# Patient Record
Sex: Female | Born: 1980 | Hispanic: Yes | Marital: Married | Smoking: Never smoker
Health system: Southern US, Community
[De-identification: ages and names within clinical notes are randomized; demographics above are authoritative.]

## PROBLEM LIST (undated history)

## (undated) ENCOUNTER — Inpatient Hospital Stay (HOSPITAL_COMMUNITY): Payer: Self-pay

## (undated) DIAGNOSIS — O139 Gestational [pregnancy-induced] hypertension without significant proteinuria, unspecified trimester: Secondary | ICD-10-CM

## (undated) DIAGNOSIS — O24419 Gestational diabetes mellitus in pregnancy, unspecified control: Secondary | ICD-10-CM

## (undated) DIAGNOSIS — I10 Essential (primary) hypertension: Secondary | ICD-10-CM

## (undated) DIAGNOSIS — G43909 Migraine, unspecified, not intractable, without status migrainosus: Secondary | ICD-10-CM

## (undated) DIAGNOSIS — E039 Hypothyroidism, unspecified: Secondary | ICD-10-CM

---

## 2011-11-04 ENCOUNTER — Encounter: Payer: Self-pay | Admitting: Emergency Medicine

## 2011-11-04 ENCOUNTER — Emergency Department (HOSPITAL_COMMUNITY)
Admission: EM | Admit: 2011-11-04 | Discharge: 2011-11-04 | Disposition: A | Discharge status: Home / Self Care | Payer: 59 | Attending: Emergency Medicine | Admitting: Emergency Medicine

## 2011-11-04 ENCOUNTER — Emergency Department (HOSPITAL_COMMUNITY): Payer: 59

## 2011-11-04 DIAGNOSIS — R079 Chest pain, unspecified: Secondary | ICD-10-CM | POA: Insufficient documentation

## 2011-11-04 DIAGNOSIS — J3489 Other specified disorders of nose and nasal sinuses: Secondary | ICD-10-CM | POA: Insufficient documentation

## 2011-11-04 DIAGNOSIS — J189 Pneumonia, unspecified organism: Secondary | ICD-10-CM | POA: Insufficient documentation

## 2011-11-04 DIAGNOSIS — R059 Cough, unspecified: Secondary | ICD-10-CM | POA: Insufficient documentation

## 2011-11-04 DIAGNOSIS — E039 Hypothyroidism, unspecified: Secondary | ICD-10-CM | POA: Insufficient documentation

## 2011-11-04 DIAGNOSIS — R05 Cough: Secondary | ICD-10-CM | POA: Insufficient documentation

## 2011-11-04 DIAGNOSIS — R112 Nausea with vomiting, unspecified: Secondary | ICD-10-CM | POA: Insufficient documentation

## 2011-11-04 HISTORY — DX: Hypothyroidism, unspecified: E03.9

## 2011-11-04 HISTORY — DX: Migraine, unspecified, not intractable, without status migrainosus: G43.909

## 2011-11-04 LAB — URINALYSIS, ROUTINE W REFLEX MICROSCOPIC
Bilirubin Urine: NEGATIVE
Glucose, UA: >1000 mg/dL — AB
Ketones, ur: NEGATIVE mg/dL
Protein, ur: 30 mg/dL — AB

## 2011-11-04 LAB — CBC
HCT: 39.5 % (ref 36.0–46.0)
MCH: 27.6 pg (ref 26.0–34.0)
MCHC: 32.9 g/dL (ref 30.0–36.0)
MCV: 83.9 fL (ref 78.0–100.0)
RDW: 13.1 % (ref 11.5–15.5)

## 2011-11-04 LAB — COMPREHENSIVE METABOLIC PANEL
AST: 30 U/L (ref 0–37)
CO2: 25 mEq/L (ref 19–32)
Calcium: 9.1 mg/dL (ref 8.4–10.5)
Creatinine, Ser: 0.62 mg/dL (ref 0.50–1.10)
GFR calc non Af Amer: >90 mL/min (ref >90)

## 2011-11-04 LAB — DIFFERENTIAL
Basophils Absolute: 0 10*3/uL (ref 0.0–0.1)
Basophils Relative: 0 % (ref 0–1)
Eosinophils Relative: 1 % (ref 0–5)
Lymphocytes Relative: 13 % (ref 12–46)
Monocytes Absolute: 0.8 10*3/uL (ref 0.1–1.0)

## 2011-11-04 LAB — URINE MICROSCOPIC-ADD ON

## 2011-11-04 MED ORDER — DEXTROSE 5 % IV SOLN
500.0000 mg | Freq: Once | INTRAVENOUS | Status: AC
  Administered 2011-11-04: 500 mg via INTRAVENOUS
  Filled 2011-11-04: qty 500

## 2011-11-04 MED ORDER — AZITHROMYCIN 250 MG PO TABS: 250.0000 mg | ORAL_TABLET | Freq: Every day | ORAL | Status: AC

## 2011-11-04 MED ORDER — SODIUM CHLORIDE 0.9 % IV BOLUS (SEPSIS)
1000.0000 mL | Freq: Once | INTRAVENOUS | Status: AC
  Administered 2011-11-04: 1000 mL via INTRAVENOUS

## 2011-11-04 MED ORDER — DEXTROSE 5 % IV SOLN
1.0000 g | Freq: Once | INTRAVENOUS | Status: AC
  Administered 2011-11-04: 1 g via INTRAVENOUS
  Filled 2011-11-04: qty 10

## 2011-11-04 MED ORDER — ONDANSETRON HCL 4 MG/2ML IJ SOLN
4.0000 mg | Freq: Once | INTRAMUSCULAR | Status: AC
  Administered 2011-11-04: 4 mg via INTRAVENOUS
  Filled 2011-11-04: qty 2

## 2011-11-04 MED ORDER — ONDANSETRON HCL 4 MG PO TABS: 4.0000 mg | ORAL_TABLET | Freq: Four times a day (QID) | ORAL | Status: AC

## 2011-11-04 NOTE — ED Notes (Signed)
PT. REPORTS SORE THROAT, VOMTTING ,  RUNNY NOSE ,HEADACHE AND RIGHT BACK PAIN ONSET 3 DAYS AGO.

## 2011-11-04 NOTE — ED Notes (Signed)
Patient currently sitting up in bed; no respiratory or acute distress noted.  Patient states that the Zofran relieved her nausea.  Patient has no other requests, questions, or concerns at this time.  Will continue to monitor.

## 2011-11-04 NOTE — ED Notes (Signed)
Patient transported to X-ray with xray tech 

## 2011-11-04 NOTE — ED Notes (Signed)
Patient states that she has had a cold for three days.  Complaining of nausea and vomiting starting tonight; states that she has thrown up 6-7 times (states that all she can throw up is mucus).  Patient reporting lightheadedness, hoarse voice, fever, and chills.  Patient denies abdominal pain, chest pain, and shortness of breath, but reports left sided flank plan (states that it started after throwing up several times).  Patient alert and oriented x4; PERRL present.  Family at bedside.  Will continue to monitor.

## 2011-11-04 NOTE — ED Provider Notes (Addendum)
History     CSN: [id number] Arrival date & time: 11/04/2011  5:14 AM   First MD Initiated Contact with Patient 11/04/11 [phone]      Chief Complaint  Patient presents with  . Emesis  . Nasal Congestion    (Consider location/radiation/quality/duration/timing/severity/associated sxs/prior treatment) Patient is a 30 y.o. female presenting with vomiting.  Emesis  This is a new problem. The current episode started 2 days ago.   patient reports cold symptoms including nasal congestion, and cough over the past few days. She also had a sore throat. She feels the cold symptoms are actually improving. However, she is now having nausea, vomiting, and has some pain in the right posterior portion of her, chest. She's had no fevers no hemoptysis. She denies any sick contacts. Denies any diarrhea or abdominal pain.  Past Medical History  Diagnosis Date  . Hypothyroidism   . Migraine headache   . Hypothyroidism     History reviewed. No pertinent past surgical history.  No family history on file.  History  Substance Use Topics  . Smoking status: Never Smoker   . Smokeless tobacco: Not on file  . Alcohol Use: No    OB History    Grav Para Term Preterm Abortions TAB SAB Ect Mult Living                  Review of Systems  Gastrointestinal: Positive for vomiting.  All other systems reviewed and are negative.    Allergies  Review of patient's allergies indicates no known allergies.  Home Medications   Current Outpatient Rx  Name Route Sig Dispense Refill  . DEXTROMETHORPHAN POLISTIREX 30 MG/5ML PO LQCR Oral Take 60 mg by mouth as needed.      . IBUPROFEN 200 MG PO TABS Oral Take 400 mg by mouth every 6 (six) hours as needed. For pain and fever     . LEVOTHYROXINE SODIUM 50 MCG PO TABS Oral Take 50 mcg by mouth daily.      Marland Kitchen OVER THE COUNTER MEDICATION  Tylenol Cold and Flu       BP 152/97  Pulse 88  Temp(Src) 100 F (37.8 C) (Oral)  Resp 20  SpO2 100%  LMP  10/31/2011  Physical Exam  Constitutional: She is oriented to person, place, and time. She appears well-developed and well-nourished. No distress.  HENT:  Head: Normocephalic and atraumatic.  Mouth/Throat: Oropharynx is clear and moist. No oropharyngeal exudate.  Eyes: Conjunctivae and EOM are normal. Pupils are equal, round, and reactive to light.  Neck: Neck supple.  Cardiovascular: Normal rate and regular rhythm.  Exam reveals no gallop and no friction rub.   No murmur heard. Pulmonary/Chest: Breath sounds normal. She has no wheezes. She has no rales. She exhibits no tenderness.  Abdominal: Soft. Bowel sounds are normal. She exhibits no distension. There is no tenderness. There is no rebound and no guarding.  Musculoskeletal: Normal range of motion.  Neurological: She is alert and oriented to person, place, and time. No cranial nerve deficit. Coordination normal.  Skin: Skin is warm and dry. No rash noted.  Psychiatric: She has a normal mood and affect.    ED Course  Procedures (including critical care time)  Labs Reviewed  CBC - Abnormal; Notable for the following:    WBC 15.9 (*)    All other components within normal limits  DIFFERENTIAL - Abnormal; Notable for the following:    Neutrophils Relative 81 (*)    Neutro Abs 12.9 (*)  All other components within normal limits  COMPREHENSIVE METABOLIC PANEL - Abnormal; Notable for the following:    Glucose, Bld 161 (*)    Total Protein 8.4 (*)    Alkaline Phosphatase 131 (*)    All other components within normal limits  URINALYSIS, ROUTINE W REFLEX MICROSCOPIC  POCT PREGNANCY, URINE   Dg Chest 2 View  11/04/2011  *RADIOLOGY REPORT*  Clinical Data: Cough, vomiting and sore throat; right posterior chest pain.  CHEST - 2 VIEW  Comparison: None.  Findings: The lungs are mildly hypoexpanded.  There is focal patchy airspace opacification at the superior aspect of the right lower lobe, compatible with pneumonia.  There is no  evidence of pleural effusion or pneumothorax.  The heart is normal in size; the mediastinal contour is within normal limits.  No acute osseous abnormalities are seen.  IMPRESSION: Focal pneumonia at the superior aspect of the right lower lobe; lungs mildly hypoexpanded.  Original Report Authenticated By: Tonia Ghent, M.D.     No diagnosis found.    MDM  Pt is seen and examined;  Initial history and physical completed.  Will follow.          Hanna Ra A. Patrica Duel, MD 11/04/11 0542    Lorelle Gibbs. Patrica Duel, MD 11/05/11 [phone]

## 2011-11-04 NOTE — ED Notes (Signed)
Patient back from x-ray.  Currently sitting up in bed; no respiratory or acute distress noted.  Patient has no questions, concerns, or requests at this time.  Family present at bedside.  Will continue to monitor.

## 2011-11-04 NOTE — ED Notes (Signed)
Pt ambulated to restroom w/o difficulties, pt was unable to void. Clean catch cup at bedside.

## 2012-07-20 ENCOUNTER — Encounter (HOSPITAL_COMMUNITY): Payer: Self-pay | Admitting: Emergency Medicine

## 2012-07-20 ENCOUNTER — Emergency Department (HOSPITAL_COMMUNITY)
Admission: EM | Admit: 2012-07-20 | Discharge: 2012-07-20 | Disposition: A | Discharge status: Home / Self Care | Payer: 59 | Attending: Emergency Medicine | Admitting: Emergency Medicine

## 2012-07-20 DIAGNOSIS — Z79899 Other long term (current) drug therapy: Secondary | ICD-10-CM | POA: Insufficient documentation

## 2012-07-20 DIAGNOSIS — E039 Hypothyroidism, unspecified: Secondary | ICD-10-CM | POA: Insufficient documentation

## 2012-07-20 DIAGNOSIS — J069 Acute upper respiratory infection, unspecified: Secondary | ICD-10-CM

## 2012-07-20 MED ORDER — OXYCODONE-ACETAMINOPHEN 5-325 MG PO TABS: 1.0000 | ORAL_TABLET | Freq: Four times a day (QID) | ORAL | Status: AC | PRN

## 2012-07-20 NOTE — ED Provider Notes (Signed)
History  This chart was scribed for [employer]. Rubin Payor, MD by Erskine Emery. This patient was seen in room TR08C/TR08C and the patient's care was started at 10:56.  CSN: [id number]  Arrival date & time 07/20/12  0945   First MD Initiated Contact with Patient 07/20/12 1056      Chief Complaint  Patient presents with  . URI  . Otalgia    (Consider location/radiation/quality/duration/timing/severity/associated sxs/prior treatment) HPI [name] Curtis is a 31 y.o. female who presents to the Emergency Department complaining of head congestion, as of yesterday.  No one sick around her Swelling on face No h/o allergies Mild fever 102,  No nausea, vomiting, no chance of pregnancy Ears clogged Hypothyroid and taking levo for it, but otherwise healthy. Taking tylenol congestion with only mild relief.    Past Medical History  Diagnosis Date  . Hypothyroidism   . Migraine headache   . Hypothyroidism     History reviewed. No pertinent past surgical history.  History reviewed. No pertinent family history.  History  Substance Use Topics  . Smoking status: Never Smoker   . Smokeless tobacco: Not on file  . Alcohol Use: No    OB History    Grav Para Term Preterm Abortions TAB SAB Ect Mult Living                  Review of Systems  Constitutional: Negative for fever and chills.  Respiratory: Negative for shortness of breath.   Gastrointestinal: Negative for nausea and vomiting.  Neurological: Negative for weakness.    Allergies  Review of patient's allergies indicates no known allergies.  Home Medications   Current Outpatient Rx  Name Route Sig Dispense Refill  . GUAIFENESIN 100 MG/5ML PO SYRP Oral Take 600 mg by mouth every 4 (four) hours as needed. For cough and congestion    . LEVOTHYROXINE SODIUM 50 MCG PO TABS Oral Take 50 mcg by mouth daily.    Marland Kitchen NAPROXEN SODIUM 220 MG PO TABS Oral Take 220-440 mg by mouth daily as needed. For pain and headache    .  PHENYLEPHRINE-DM-GG-APAP 5-10-200-325 MG PO TABS Oral Take 2 tablets by mouth every 4 (four) hours as needed. For headache and sinus pressure    . OXYCODONE-ACETAMINOPHEN 5-325 MG PO TABS Oral Take 1-2 tablets by mouth every 6 (six) hours as needed for pain. 10 tablet 0    BP 196/109  Pulse 94  Temp 98.5 F (36.9 C) (Oral)  Resp 18  SpO2 98%  Physical Exam  Nursing note and vitals reviewed. Constitutional: She is oriented to person, place, and time. She appears well-developed and well-nourished. No distress.  HENT:  Head: Normocephalic and atraumatic.       Mild bilat tm, no bulging, lungs clear trache mildling, anter cev aden, tender   Eyes: EOM are normal.  Neck: Neck supple. No tracheal deviation present.  Cardiovascular: Normal rate.   Pulmonary/Chest: Effort normal. No respiratory distress.  Musculoskeletal: Normal range of motion.  Neurological: She is alert and oriented to person, place, and time. She displays abnormal reflex.  Skin: Skin is warm and dry.  Psychiatric: She has a normal mood and affect. Her behavior is normal.    ED Course  Procedures (including critical care time) DIAGNOSTIC STUDIES: Oxygen Saturation is 98% on room air, normal by my interpretation.    COORDINATION OF CARE: 11:08--I evaluated the patient and we discussed a treatment plan including pain medication and decongestants to which the pt agreed. I notified  her to come back if her symptoms do not improve after a few days.    Labs Reviewed - No data to display No results found.   1. URI (upper respiratory infection)       MDM   Patient with URI symptoms. She's had for 2 days. Lungs are clear posterior pharynx does not look like strep. She has some hypertension which may be followed, but will not be treated now. She'll be given pain medicines and was given followup instructions.      I personally performed the services described in this documentation, which was scribed in my presence.  The recorded information has been reviewed and considered.           Juliet Rude. Rubin Payor, MD 07/20/12 1128

## 2012-07-20 NOTE — ED Notes (Signed)
Pt c/o head congestion and sinus pressure x 2 days with earache and chills

## 2012-12-19 NOTE — L&D Delivery Note (Signed)
Delivery Note  SVD viable female Apgars 9,9 over 2nd degree ML lac.  Placenta delivered spontaneously intact with 3VC. Repair with 2-0 Chromic with good support and hemostasis noted and R/V exam confirms.  PH art was sent.  [place] cord blood was not done.  Mother and baby were doing well.  EBL 350cc  Candice Camp, MD

## 2013-04-03 LAB — OB RESULTS CONSOLE ABO/RH: "RH Type ": POSITIVE

## 2013-04-03 LAB — OB RESULTS CONSOLE RUBELLA ANTIBODY, IGM: Rubella: IMMUNE

## 2013-04-03 LAB — OB RESULTS CONSOLE GC/CHLAMYDIA
Chlamydia: NEGATIVE
Gonorrhea: NEGATIVE

## 2013-04-03 LAB — OB RESULTS CONSOLE RPR: RPR: NONREACTIVE

## 2013-04-03 LAB — OB RESULTS CONSOLE GBS: GBS: NEGATIVE

## 2013-04-03 LAB — OB RESULTS CONSOLE ANTIBODY SCREEN: Antibody Screen: NEGATIVE

## 2013-09-04 ENCOUNTER — Inpatient Hospital Stay (HOSPITAL_COMMUNITY): Payer: 59

## 2013-09-04 ENCOUNTER — Inpatient Hospital Stay (HOSPITAL_COMMUNITY)
Admission: AD | Admit: 2013-09-04 | Discharge: 2013-09-04 | Disposition: A | Discharge status: Home / Self Care | Payer: 59 | Source: Ambulatory Visit | Attending: Obstetrics and Gynecology | Admitting: Obstetrics and Gynecology

## 2013-09-04 ENCOUNTER — Encounter (HOSPITAL_COMMUNITY): Payer: Self-pay

## 2013-09-04 DIAGNOSIS — O99891 Other specified diseases and conditions complicating pregnancy: Secondary | ICD-10-CM | POA: Insufficient documentation

## 2013-09-04 DIAGNOSIS — R51 Headache: Secondary | ICD-10-CM | POA: Insufficient documentation

## 2013-09-04 DIAGNOSIS — R030 Elevated blood-pressure reading, without diagnosis of hypertension: Secondary | ICD-10-CM | POA: Insufficient documentation

## 2013-09-04 LAB — URINALYSIS, ROUTINE W REFLEX MICROSCOPIC
Bilirubin Urine: NEGATIVE
Glucose, UA: 100 mg/dL — AB
Hgb urine dipstick: NEGATIVE
Ketones, ur: NEGATIVE mg/dL
Protein, ur: NEGATIVE mg/dL

## 2013-09-04 LAB — CBC
HCT: 35.4 % — ABNORMAL LOW (ref 36.0–46.0)
MCH: 30.1 pg (ref 26.0–34.0)
MCHC: 34.5 g/dL (ref 30.0–36.0)
MCV: 87.4 fL (ref 78.0–100.0)
RDW: 14.1 % (ref 11.5–15.5)

## 2013-09-04 LAB — COMPREHENSIVE METABOLIC PANEL
Albumin: 3 g/dL — ABNORMAL LOW (ref 3.5–5.2)
BUN: 6 mg/dL (ref 6–23)
Calcium: 10 mg/dL (ref 8.4–10.5)
Creatinine, Ser: 0.56 mg/dL (ref 0.50–1.10)
GFR calc Af Amer: >90 mL/min (ref >90)
Total Protein: 7.2 g/dL (ref 6.0–8.3)

## 2013-09-04 LAB — LACTATE DEHYDROGENASE: LDH: 138 U/L (ref 94–250)

## 2013-09-04 LAB — URINE MICROSCOPIC-ADD ON

## 2013-09-04 LAB — URIC ACID: Uric Acid, Serum: 2.9 mg/dL (ref 2.4–7.0)

## 2013-09-04 MED ORDER — ACETAMINOPHEN 325 MG PO TABS
650.0000 mg | ORAL_TABLET | Freq: Once | ORAL | Status: AC
  Administered 2013-09-04: 650 mg via ORAL

## 2013-09-04 NOTE — MAU Provider Note (Signed)
  History     CSN: [id number]  Arrival date and time: 09/04/13 1210   None     Chief Complaint  Patient presents with  . Hypertension  . Facial Swelling  . Headache   HPI [name] Curtis is 32 y.o. G3P0020 [redacted]w[redacted]d weeks presenting for elevated blood pressure in pregnancy.  Patient of Dr.Adkins but was seen in the office today by Dr. Arelia Sneddon.  Her blood pressure was elevated there.  She was sent here for further evaluation.  Dr. Arelia Sneddon called in orders.  Hx of hypertension with this pregnancy in the first trimester.  Is treated with labetalol 200mg  tid.  Has moderate headache that she has not taken anything. Denies visual changes.   Had nausea and vomiting this am that she feels is related to headache. Has swelling in her hands and feet.  Neg protein in her urine today per her report.  Passed her diabetes testing.     Past Medical History  Diagnosis Date  . Hypothyroidism   . Migraine headache   . Hypothyroidism     History reviewed. No pertinent past surgical history.  History reviewed. No pertinent family history.  History  Substance Use Topics  . Smoking status: Never Smoker   . Smokeless tobacco: Not on file  . Alcohol Use: No    Allergies: No Known Allergies  Prescriptions prior to admission  Medication Sig Dispense Refill  . labetalol (NORMODYNE) 200 MG tablet Take 200 mg by mouth 3 (three) times daily.      Marland Kitchen levothyroxine (SYNTHROID, LEVOTHROID) 50 MCG tablet Take 50 mcg by mouth daily.      . Prenatal Vit-Fe Fumarate-FA (PRENATAL MULTIVITAMIN) TABS tablet Take 1 tablet by mouth daily at 12 noon.        ROS Physical Exam   Blood pressure 153/76, pulse 88, resp. rate 20, height 5' 2.5" (1.588 m), weight 229 lb 12.8 oz (104.237 kg), SpO2 98.00%.  Physical Exam  MAU Course  Procedures  MDM Tylenol 650mg  po given for headache.  Darl Pikes, RN will report labs, blood pressure readings and U/S (BPP8/8 and measurements to Dr. Arelia Sneddon)  Assessment and Plan  A:   Medical Screening Exam  P:  PLan of care per Dr. Levie Heritage M 09/04/2013, 3:09 PM

## 2013-09-04 NOTE — MAU Note (Signed)
Patient was seen in the office this am and had elevated blood pressure and increase in swelling in face, hands and feet. Was sent to MAU for further evaluation. Patient states she vomited once this am and now has a headache. Denies bleeding, leaking or contractions and reports good fetal movement.

## 2013-09-23 ENCOUNTER — Encounter (HOSPITAL_COMMUNITY): Payer: Self-pay | Admitting: *Deleted

## 2013-09-23 ENCOUNTER — Observation Stay (HOSPITAL_COMMUNITY)
Admission: AD | Admit: 2013-09-23 | Discharge: 2013-09-24 | Disposition: A | Discharge status: Home / Self Care | Payer: 59 | Source: Ambulatory Visit | Attending: Obstetrics and Gynecology | Admitting: Obstetrics and Gynecology

## 2013-09-23 ENCOUNTER — Observation Stay (HOSPITAL_COMMUNITY): Payer: 59

## 2013-09-23 DIAGNOSIS — O10019 Pre-existing essential hypertension complicating pregnancy, unspecified trimester: Principal | ICD-10-CM | POA: Insufficient documentation

## 2013-09-23 DIAGNOSIS — O10913 Unspecified pre-existing hypertension complicating pregnancy, third trimester: Secondary | ICD-10-CM

## 2013-09-23 LAB — COMPREHENSIVE METABOLIC PANEL
ALT: 13 U/L (ref 0–35)
AST: 20 U/L (ref 0–37)
Albumin: 2.8 g/dL — ABNORMAL LOW (ref 3.5–5.2)
CO2: 20 mEq/L (ref 19–32)
Calcium: 9.9 mg/dL (ref 8.4–10.5)
Chloride: 99 mEq/L (ref 96–112)
GFR calc non Af Amer: >90 mL/min (ref >90)
Sodium: 133 mEq/L — ABNORMAL LOW (ref 135–145)

## 2013-09-23 LAB — CBC
HCT: 35 % — ABNORMAL LOW (ref 36.0–46.0)
MCH: 29.8 pg (ref 26.0–34.0)
MCHC: 34 g/dL (ref 30.0–36.0)
MCV: 87.5 fL (ref 78.0–100.0)
RDW: 14.5 % (ref 11.5–15.5)

## 2013-09-23 MED ORDER — ACETAMINOPHEN 325 MG PO TABS
650.0000 mg | ORAL_TABLET | ORAL | Status: DC | PRN
  Administered 2013-09-24: 650 mg via ORAL
  Filled 2013-09-23: qty 2

## 2013-09-23 MED ORDER — PRENATAL MULTIVITAMIN CH
1.0000 | ORAL_TABLET | Freq: Every day | ORAL | Status: DC
  Administered 2013-09-24: 1 via ORAL
  Filled 2013-09-23: qty 1

## 2013-09-23 MED ORDER — LABETALOL HCL 300 MG PO TABS
300.0000 mg | ORAL_TABLET | Freq: Three times a day (TID) | ORAL | Status: DC
  Administered 2013-09-23 – 2013-09-24 (×3): 300 mg via ORAL
  Filled 2013-09-23 (×5): qty 1

## 2013-09-23 MED ORDER — ZOLPIDEM TARTRATE 5 MG PO TABS: 5.0000 mg | ORAL_TABLET | Freq: Every evening | ORAL | Status: DC | PRN

## 2013-09-23 MED ORDER — CALCIUM CARBONATE ANTACID 500 MG PO CHEW: 2.0000 | CHEWABLE_TABLET | ORAL | Status: DC | PRN

## 2013-09-23 MED ORDER — DOCUSATE SODIUM 100 MG PO CAPS
100.0000 mg | ORAL_CAPSULE | Freq: Every day | ORAL | Status: DC
  Administered 2013-09-24: 100 mg via ORAL
  Filled 2013-09-23: qty 1

## 2013-09-23 MED ORDER — LEVOTHYROXINE SODIUM 50 MCG PO TABS
50.0000 ug | ORAL_TABLET | Freq: Every day | ORAL | Status: DC
  Administered 2013-09-23: 50 ug via ORAL
  Filled 2013-09-23 (×2): qty 1

## 2013-09-23 MED ORDER — PRENATAL MULTIVITAMIN CH: 1.0000 | ORAL_TABLET | Freq: Every day | ORAL | Status: DC

## 2013-09-23 NOTE — H&P (Signed)
[name] Curtis is a 32 y.o. female presenting at 34.2 with elevated blood pressure.  History of chronic hypertension on labetalol 300 mg tid.  Blood pressure today in office 180/102.  No PIH syptoms. Maternal Medical History:  Reason for admission: Elevated blood pressure  Fetal activity: Perceived fetal activity is normal.    Prenatal complications: PIH.   Prenatal Complications - Diabetes: none.    OB History   Grav Para Term Preterm Abortions TAB SAB Ect Mult Living   3    2 2     0     Past Medical History  Diagnosis Date  . Hypothyroidism   . Migraine headache   . Hypothyroidism    History reviewed. No pertinent past surgical history. Family History: family history includes Hypertension in her mother. Social History:  reports that she has never smoked. She does not have any smokeless tobacco history on file. She reports that she does not drink alcohol or use illicit drugs.   Prenatal Transfer Tool  Maternal Diabetes: No Genetic Screening: Normal Maternal Ultrasounds/Referrals: Normal Fetal Ultrasounds or other Referrals:  None Maternal Substance Abuse:  No Significant Maternal Medications:  None Significant Maternal Lab Results:  None Other Comments:  None  ROS    Blood pressure 151/80, pulse 80, temperature 98.5 F (36.9 C), temperature source Oral, resp. rate 24, height 5\' 2"  (1.575 m), weight 107.321 kg (236 lb 9.6 oz). Maternal Exam:  Abdomen: Patient reports no abdominal tenderness. Fundal height is c/w dates.   Fetal presentation: vertex  Cervix: not evaluated.   Physical Exam  Constitutional: She is oriented to person, place, and time.  Cardiovascular: Normal rate, regular rhythm and normal heart sounds.   Respiratory: Effort normal and breath sounds normal.  GI: Soft. Bowel sounds are normal.  gravid uterus c/w dates  Neurological: She is alert and oriented to person, place, and time. She has normal reflexes.    Prenatal labs: ABO, Rh:    Antibody:   Rubella:   RPR:    HBsAg:    HIV:    GBS:     Assessment/Plan: Intrauterine pregnancy at 34.2 with chronic hypertension with elevated blood pressure. Check labs nst q shift sono in am continue labetalol.   [name] Curtis S 09/23/2013, 2:32 PM

## 2013-09-24 ENCOUNTER — Observation Stay (HOSPITAL_COMMUNITY): Payer: 59

## 2013-09-24 LAB — CREATININE CLEARANCE, URINE, 24 HOUR
Collection Interval-CRCL: 24 h
Creatinine Clearance: 226 mL/min — ABNORMAL HIGH (ref 75–115)
Creatinine, 24H Ur: 2015 mg/d — ABNORMAL HIGH (ref 700–1800)
Creatinine, Urine: 82.24 mg/dL
Creatinine: 0.62 mg/dL (ref 0.50–1.10)
Urine Total Volume-CRCL: 2450 mL

## 2013-09-24 LAB — CBC
HCT: 33.1 % — ABNORMAL LOW (ref 36.0–46.0)
Hemoglobin: 11.3 g/dL — ABNORMAL LOW (ref 12.0–15.0)
MCH: 30 pg (ref 26.0–34.0)
MCHC: 34.1 g/dL (ref 30.0–36.0)
MCV: 87.8 fL (ref 78.0–100.0)
Platelets: 198 K/uL (ref 150–400)
RBC: 3.77 MIL/uL — ABNORMAL LOW (ref 3.87–5.11)
RDW: 14.5 % (ref 11.5–15.5)
WBC: 10.5 K/uL (ref 4.0–10.5)

## 2013-09-24 MED ORDER — LABETALOL HCL 300 MG PO TABS: 300.0000 mg | ORAL_TABLET | Freq: Three times a day (TID) | ORAL | Status: DC

## 2013-09-24 NOTE — Progress Notes (Signed)
Patient is resting well  Denies headache  Fetal movement is normal  FHR is category 1  Exam is normal Blood pressure is improved 24 hour urine complete today after 3 pm Platelets this am are normal  IMPRESSION: IUP at 34 w 3 days Chronic Hypertension  PLAN: Complete 24 hour urine  Repeat BPP If reassuring then discharge home on Labetalol

## 2013-09-24 NOTE — Discharge Summary (Signed)
  Admission Diagnosis: IUP at 34 weeks and 3 days Hypertension  Discharge Diagnosis: Same Probable Chronic Hypertension  Hospital Course: 32 year old female with hypertension sent from office for observation. 24 hour urine collected and results pending. PIH labs otherwise unremarkable. On day of discharge, blood pressure responded to labetalol 300 tid and she was sent home after BPP 8/8 She will follow up in office on Thursday.

## 2013-09-24 NOTE — Progress Notes (Signed)
UR chart review completed.  

## 2013-09-25 LAB — PROTEIN, URINE, 24 HOUR
Protein, 24H Urine: 196 mg/d — ABNORMAL HIGH (ref 50–100)
Urine Total Volume-UPROT: 2450 mL

## 2013-10-09 ENCOUNTER — Telehealth (HOSPITAL_COMMUNITY): Payer: Self-pay | Admitting: *Deleted

## 2013-10-09 NOTE — Telephone Encounter (Signed)
Preadmission screen  

## 2013-10-16 ENCOUNTER — Inpatient Hospital Stay (HOSPITAL_COMMUNITY): Admission: RE | Admit: 2013-10-16 | Payer: 59 | Source: Ambulatory Visit

## 2013-10-19 ENCOUNTER — Inpatient Hospital Stay (HOSPITAL_COMMUNITY)
Admission: RE | Admit: 2013-10-19 | Discharge: 2013-10-24 | DRG: 774 | Disposition: A | Discharge status: Home / Self Care | Payer: 59 | Source: Ambulatory Visit | Attending: Obstetrics and Gynecology | Admitting: Obstetrics and Gynecology

## 2013-10-19 DIAGNOSIS — O1002 Pre-existing essential hypertension complicating childbirth: Principal | ICD-10-CM | POA: Diagnosis present

## 2013-10-19 DIAGNOSIS — E079 Disorder of thyroid, unspecified: Secondary | ICD-10-CM | POA: Diagnosis present

## 2013-10-19 DIAGNOSIS — E039 Hypothyroidism, unspecified: Secondary | ICD-10-CM | POA: Diagnosis present

## 2013-10-19 LAB — CBC
HCT: 36.7 % (ref 36.0–46.0)
MCH: 30.2 pg (ref 26.0–34.0)
MCHC: 33.8 g/dL (ref 30.0–36.0)
MCV: 89.3 fL (ref 78.0–100.0)
Platelets: 231 10*3/uL (ref 150–400)
RDW: 15 % (ref 11.5–15.5)

## 2013-10-19 MED ORDER — FLEET ENEMA 7-19 GM/118ML RE ENEM: 1.0000 | ENEMA | RECTAL | Status: DC | PRN

## 2013-10-19 MED ORDER — MISOPROSTOL 25 MCG QUARTER TABLET
25.0000 ug | ORAL_TABLET | ORAL | Status: DC | PRN
  Administered 2013-10-19 – 2013-10-21 (×5): 25 ug via VAGINAL
  Filled 2013-10-19 (×5): qty 0.25

## 2013-10-19 MED ORDER — LACTATED RINGERS IV SOLN: 500.0000 mL | INTRAVENOUS | Status: DC | PRN

## 2013-10-19 MED ORDER — OXYTOCIN BOLUS FROM INFUSION: 500.0000 mL | INTRAVENOUS | Status: DC

## 2013-10-19 MED ORDER — TERBUTALINE SULFATE 1 MG/ML IJ SOLN: 0.2500 mg | Freq: Once | INTRAMUSCULAR | Status: AC | PRN

## 2013-10-19 MED ORDER — ONDANSETRON HCL 4 MG/2ML IJ SOLN: 4.0000 mg | Freq: Four times a day (QID) | INTRAMUSCULAR | Status: DC | PRN

## 2013-10-19 MED ORDER — LEVOTHYROXINE SODIUM 50 MCG PO TABS
50.0000 ug | ORAL_TABLET | Freq: Every day | ORAL | Status: DC
  Administered 2013-10-19 – 2013-10-21 (×3): 50 ug via ORAL
  Filled 2013-10-19 (×3): qty 1

## 2013-10-19 MED ORDER — LABETALOL HCL 300 MG PO TABS
300.0000 mg | ORAL_TABLET | Freq: Three times a day (TID) | ORAL | Status: DC
  Administered 2013-10-19 – 2013-10-21 (×7): 300 mg via ORAL
  Filled 2013-10-19 (×9): qty 1

## 2013-10-19 MED ORDER — CITRIC ACID-SODIUM CITRATE 334-500 MG/5ML PO SOLN: 30.0000 mL | ORAL | Status: DC | PRN

## 2013-10-19 MED ORDER — ZOLPIDEM TARTRATE 5 MG PO TABS
5.0000 mg | ORAL_TABLET | Freq: Every evening | ORAL | Status: DC | PRN
  Administered 2013-10-19: 5 mg via ORAL
  Filled 2013-10-19: qty 1

## 2013-10-19 MED ORDER — ACETAMINOPHEN 325 MG PO TABS
650.0000 mg | ORAL_TABLET | ORAL | Status: DC | PRN
  Administered 2013-10-21: 650 mg via ORAL
  Filled 2013-10-19: qty 2

## 2013-10-19 MED ORDER — OXYTOCIN 40 UNITS IN LACTATED RINGERS INFUSION - SIMPLE MED
62.5000 mL/h | INTRAVENOUS | Status: DC
  Filled 2013-10-19 (×2): qty 1000

## 2013-10-19 MED ORDER — LACTATED RINGERS IV SOLN
INTRAVENOUS | Status: DC
  Administered 2013-10-19 – 2013-10-21 (×4): via INTRAVENOUS

## 2013-10-19 MED ORDER — IBUPROFEN 600 MG PO TABS
600.0000 mg | ORAL_TABLET | Freq: Four times a day (QID) | ORAL | Status: DC | PRN
  Administered 2013-10-21: 600 mg via ORAL
  Filled 2013-10-19: qty 1

## 2013-10-19 MED ORDER — LEVOTHYROXINE SODIUM 50 MCG PO TABS
50.0000 ug | ORAL_TABLET | Freq: Every day | ORAL | Status: DC
  Filled 2013-10-19: qty 1

## 2013-10-19 MED ORDER — LIDOCAINE HCL (PF) 1 % IJ SOLN
30.0000 mL | INTRAMUSCULAR | Status: AC | PRN
  Administered 2013-10-21: 30 mL via SUBCUTANEOUS
  Filled 2013-10-19 (×2): qty 30

## 2013-10-19 MED ORDER — OXYCODONE-ACETAMINOPHEN 5-325 MG PO TABS: 1.0000 | ORAL_TABLET | ORAL | Status: DC | PRN

## 2013-10-19 NOTE — H&P (Signed)
[name] Curtis is a 32 y.o. female presenting at 11 weeks for induction of labor.  Prenatal course complicated by chronic hypertension.  Has been on labetalol..  GBS negative./ Maternal Medical History:  Reason for admission: Induction   Prenatal complications: PIH.   Prenatal Complications - Diabetes: none.    OB History   Grav Para Term Preterm Abortions TAB SAB Ect Mult Living   3    2 2     0     Past Medical History  Diagnosis Date  . Hypothyroidism   . Migraine headache   . Hypothyroidism    No past surgical history on file. Family History: family history includes Hypertension in her mother. Social History:  reports that she has never smoked. She does not have any smokeless tobacco history on file. She reports that she does not drink alcohol or use illicit drugs.   Prenatal Transfer Tool  Maternal Diabetes: No Genetic Screening: Normal Maternal Ultrasounds/Referrals: Normal Fetal Ultrasounds or other Referrals:  None Maternal Substance Abuse:  No Significant Maternal Medications:  Meds include: Other: labetalol Significant Maternal Lab Results:  None Other Comments:  None  ROS    Blood pressure 176/91, pulse 94, resp. rate 20, height 5\' 2"  (1.575 m), weight 107.049 kg (236 lb). Maternal Exam:  Uterine Assessment: Contraction strength is mild.  Contraction frequency is irregular.   Abdomen: Patient reports no abdominal tenderness. Fundal height is c/w dates.   Estimated fetal weight is 7.   Fetal presentation: vertex     Physical Exam  Constitutional: She is oriented to person, place, and time. She appears well-developed and well-nourished.  Cardiovascular: Normal rate, regular rhythm and normal heart sounds.   Respiratory: Effort normal and breath sounds normal.  GI:  Gravid uterus  Genitourinary:  Cervix fingertip  Musculoskeletal: She exhibits edema.  Neurological: She is oriented to person, place, and time. She has normal reflexes.    Prenatal  labs: ABO, Rh: A/Positive/-- (04/16 0000) Antibody: Negative (04/16 0000) Rubella: Immune (04/16 0000) RPR: Nonreactive (04/16 0000)  HBsAg: Negative (04/16 0000)  HIV: Non-reactive (04/16 0000)  GBS: Negative (04/16 0000)   Assessment/Plan: IUP at 39 weeks with chronic hypertension for induction Risk of pitocin discussed   [name] Curtis S 10/19/2013, 8:19 PM

## 2013-10-20 ENCOUNTER — Encounter (HOSPITAL_COMMUNITY): Payer: Self-pay

## 2013-10-20 LAB — SYPHILIS: RPR W/REFLEX TO RPR TITER AND TREPONEMAL ANTIBODIES, TRADITIONAL SCREENING AND DIAGNOSIS ALGORITHM: RPR Ser Ql: NONREACTIVE

## 2013-10-20 LAB — TYPE AND SCREEN
ABO/RH(D): A POS
Antibody Screen: NEGATIVE

## 2013-10-20 MED ORDER — OXYTOCIN 40 UNITS IN LACTATED RINGERS INFUSION - SIMPLE MED
1.0000 m[IU]/min | INTRAVENOUS | Status: DC
  Administered 2013-10-20: 2 m[IU]/min via INTRAVENOUS

## 2013-10-20 MED ORDER — ZOLPIDEM TARTRATE 5 MG PO TABS: 10.0000 mg | ORAL_TABLET | Freq: Every evening | ORAL | Status: DC | PRN

## 2013-10-20 MED ORDER — TERBUTALINE SULFATE 1 MG/ML IJ SOLN: 0.2500 mg | Freq: Once | INTRAMUSCULAR | Status: AC | PRN

## 2013-10-20 MED ORDER — MISOPROSTOL 25 MCG QUARTER TABLET
25.0000 ug | ORAL_TABLET | ORAL | Status: DC | PRN
  Filled 2013-10-20: qty 0.25

## 2013-10-20 NOTE — Progress Notes (Signed)
Patient ID: [name] Curtis, female   DOB: 1981/05/26, 32 y.o.   MRN: [id number] No progress on pitocin Cervix 1 cm 50 %  fhr cat one Stop pitocin and restart cytotec

## 2013-10-20 NOTE — Progress Notes (Signed)
Patient ID: [name] Curtis, female   DOB: 1981-11-25, 32 y.o.   MRN: [id number] Cervix 1 +  60 % vtx -2 fhr cat one Unable to rupture membranes Begin pitocin

## 2013-10-21 ENCOUNTER — Inpatient Hospital Stay (HOSPITAL_COMMUNITY): Payer: 59 | Admitting: Anesthesiology

## 2013-10-21 ENCOUNTER — Encounter (HOSPITAL_COMMUNITY): Payer: 59 | Admitting: Anesthesiology

## 2013-10-21 ENCOUNTER — Encounter (HOSPITAL_COMMUNITY): Payer: Self-pay

## 2013-10-21 LAB — CBC
Hemoglobin: 12.4 g/dL (ref 12.0–15.0)
MCH: 30.2 pg (ref 26.0–34.0)
MCHC: 34.2 g/dL (ref 30.0–36.0)
MCV: 88.3 fL (ref 78.0–100.0)
RDW: 15.1 % (ref 11.5–15.5)

## 2013-10-21 MED ORDER — EPHEDRINE 5 MG/ML INJ: 10.0000 mg | INTRAVENOUS | Status: DC | PRN

## 2013-10-21 MED ORDER — BENZOCAINE-MENTHOL 20-0.5 % EX AERO
1.0000 "application " | INHALATION_SPRAY | CUTANEOUS | Status: DC | PRN
  Administered 2013-10-22: 1 via TOPICAL
  Filled 2013-10-21 (×3): qty 56

## 2013-10-21 MED ORDER — IBUPROFEN 600 MG PO TABS
600.0000 mg | ORAL_TABLET | Freq: Four times a day (QID) | ORAL | Status: DC
  Administered 2013-10-22 – 2013-10-24 (×7): 600 mg via ORAL
  Filled 2013-10-21 (×8): qty 1

## 2013-10-21 MED ORDER — FENTANYL 2.5 MCG/ML BUPIVACAINE 1/10 % EPIDURAL INFUSION (WH - ANES)
14.0000 mL/h | INTRAMUSCULAR | Status: DC | PRN
  Administered 2013-10-21: 14 mL/h via EPIDURAL
  Filled 2013-10-21 (×2): qty 125

## 2013-10-21 MED ORDER — OXYCODONE-ACETAMINOPHEN 5-325 MG PO TABS: 1.0000 | ORAL_TABLET | ORAL | Status: DC | PRN

## 2013-10-21 MED ORDER — SENNOSIDES-DOCUSATE SODIUM 8.6-50 MG PO TABS
2.0000 | ORAL_TABLET | ORAL | Status: DC
  Filled 2013-10-21: qty 2

## 2013-10-21 MED ORDER — ONDANSETRON HCL 4 MG PO TABS: 4.0000 mg | ORAL_TABLET | ORAL | Status: DC | PRN

## 2013-10-21 MED ORDER — PRENATAL MULTIVITAMIN CH
1.0000 | ORAL_TABLET | Freq: Every day | ORAL | Status: DC
  Administered 2013-10-22 – 2013-10-23 (×2): 1 via ORAL
  Filled 2013-10-21 (×2): qty 1

## 2013-10-21 MED ORDER — LIDOCAINE HCL (PF) 1 % IJ SOLN
INTRAMUSCULAR | Status: DC | PRN
  Administered 2013-10-21 (×2): 4 mL

## 2013-10-21 MED ORDER — TETANUS-DIPHTH-ACELL PERTUSSIS 5-2.5-18.5 LF-MCG/0.5 IM SUSP
0.5000 mL | Freq: Once | INTRAMUSCULAR | Status: DC
  Filled 2013-10-21: qty 0.5

## 2013-10-21 MED ORDER — DIBUCAINE 1 % RE OINT
1.0000 "application " | TOPICAL_OINTMENT | RECTAL | Status: DC | PRN
  Filled 2013-10-21: qty 28

## 2013-10-21 MED ORDER — LANOLIN HYDROUS EX OINT: TOPICAL_OINTMENT | CUTANEOUS | Status: DC | PRN

## 2013-10-21 MED ORDER — PHENYLEPHRINE 40 MCG/ML (10ML) SYRINGE FOR IV PUSH (FOR BLOOD PRESSURE SUPPORT): 80.0000 ug | PREFILLED_SYRINGE | INTRAVENOUS | Status: DC | PRN

## 2013-10-21 MED ORDER — LEVOTHYROXINE SODIUM 50 MCG PO TABS
50.0000 ug | ORAL_TABLET | Freq: Every day | ORAL | Status: DC
  Administered 2013-10-22 – 2013-10-23 (×2): 50 ug via ORAL
  Filled 2013-10-21 (×4): qty 1

## 2013-10-21 MED ORDER — MEDROXYPROGESTERONE ACETATE 150 MG/ML IM SUSP: 150.0000 mg | INTRAMUSCULAR | Status: DC | PRN

## 2013-10-21 MED ORDER — SIMETHICONE 80 MG PO CHEW: 80.0000 mg | CHEWABLE_TABLET | ORAL | Status: DC | PRN

## 2013-10-21 MED ORDER — DIPHENHYDRAMINE HCL 25 MG PO CAPS: 25.0000 mg | ORAL_CAPSULE | Freq: Four times a day (QID) | ORAL | Status: DC | PRN

## 2013-10-21 MED ORDER — OXYTOCIN 40 UNITS IN LACTATED RINGERS INFUSION - SIMPLE MED
1.0000 m[IU]/min | INTRAVENOUS | Status: DC
  Administered 2013-10-21: 2 m[IU]/min via INTRAVENOUS

## 2013-10-21 MED ORDER — EPHEDRINE 5 MG/ML INJ
10.0000 mg | INTRAVENOUS | Status: DC | PRN
  Filled 2013-10-21: qty 4

## 2013-10-21 MED ORDER — LACTATED RINGERS IV SOLN
500.0000 mL | Freq: Once | INTRAVENOUS | Status: AC
  Administered 2013-10-21: 500 mL via INTRAVENOUS

## 2013-10-21 MED ORDER — ZOLPIDEM TARTRATE 5 MG PO TABS: 5.0000 mg | ORAL_TABLET | Freq: Every evening | ORAL | Status: DC | PRN

## 2013-10-21 MED ORDER — MEASLES, MUMPS & RUBELLA VAC ~~LOC~~ INJ
0.5000 mL | INJECTION | Freq: Once | SUBCUTANEOUS | Status: DC
  Filled 2013-10-21: qty 0.5

## 2013-10-21 MED ORDER — ONDANSETRON HCL 4 MG/2ML IJ SOLN: 4.0000 mg | INTRAMUSCULAR | Status: DC | PRN

## 2013-10-21 MED ORDER — FENTANYL 2.5 MCG/ML BUPIVACAINE 1/10 % EPIDURAL INFUSION (WH - ANES)
INTRAMUSCULAR | Status: DC | PRN
  Administered 2013-10-21: 12 mL/h via EPIDURAL

## 2013-10-21 MED ORDER — DIPHENHYDRAMINE HCL 50 MG/ML IJ SOLN: 12.5000 mg | INTRAMUSCULAR | Status: DC | PRN

## 2013-10-21 MED ORDER — WITCH HAZEL-GLYCERIN EX PADS: 1.0000 "application " | MEDICATED_PAD | CUTANEOUS | Status: DC | PRN

## 2013-10-21 MED ORDER — PHENYLEPHRINE 40 MCG/ML (10ML) SYRINGE FOR IV PUSH (FOR BLOOD PRESSURE SUPPORT)
80.0000 ug | PREFILLED_SYRINGE | INTRAVENOUS | Status: DC | PRN
  Filled 2013-10-21: qty 10

## 2013-10-21 NOTE — Progress Notes (Signed)
Pt states that neck pain is a little better due to being on side for a while

## 2013-10-21 NOTE — Progress Notes (Signed)
Patient ID: [name] Curtis, female   DOB: Aug 30, 1981, 32 y.o.   MRN: [id number] Pt without complaints VS BPs 140s/80-90s FHR 145s Cat 1 Ctx q 5 '  Cx  2-3/70/-3 AROM clear DTRS 1/4   CHTN - Continue induction Stable Anticipate SVD

## 2013-10-21 NOTE — Anesthesia Preprocedure Evaluation (Signed)
Anesthesia Evaluation  Patient identified by MRN, date of birth, ID band Patient awake    Reviewed: Allergy & Precautions, H&P , Patient's Chart, lab work & pertinent test results  Airway Mallampati: III TM Distance: >3 FB Neck ROM: Full    Dental no notable dental hx. (+) Teeth Intact   Pulmonary neg pulmonary ROS,  breath sounds clear to auscultation  Pulmonary exam normal       Cardiovascular hypertension, Pt. on medications and Pt. on home beta blockers Rhythm:Regular Rate:Normal  Gestational HTN   Neuro/Psych  Headaches, negative psych ROS   GI/Hepatic Neg liver ROS, GERD-  ,  Endo/Other  Hypothyroidism Morbid obesity  Renal/GU negative Renal ROS  negative genitourinary   Musculoskeletal negative musculoskeletal ROS (+)   Abdominal (+) + obese,   Peds  Hematology negative hematology ROS (+)   Anesthesia Other Findings   Reproductive/Obstetrics (+) Pregnancy                           Anesthesia Physical Anesthesia Plan  ASA: III  Anesthesia Plan: Epidural   Post-op Pain Management:    Induction:   Airway Management Planned: Natural Airway  Additional Equipment:   Intra-op Plan:   Post-operative Plan:   Informed Consent: I have reviewed the patients History and Physical, chart, labs and discussed the procedure including the risks, benefits and alternatives for the proposed anesthesia with the patient or authorized representative who has indicated his/her understanding and acceptance.     Plan Discussed with: Anesthesiologist  Anesthesia Plan Comments:         Anesthesia Quick Evaluation

## 2013-10-21 NOTE — Anesthesia Procedure Notes (Signed)
Epidural Patient location during procedure: OB Start time: 10/21/2013 10:18 AM  Staffing Anesthesiologist: Runell Kovich A. Performed by: anesthesiologist   Preanesthetic Checklist Completed: patient identified, site marked, surgical consent, pre-op evaluation, timeout performed, IV checked, risks and benefits discussed and monitors and equipment checked  Epidural Patient position: sitting Prep: site prepped and draped and DuraPrep Patient monitoring: continuous pulse ox and blood pressure Approach: midline Injection technique: LOR air  Needle:  Needle type: Tuohy  Needle gauge: 17 G Needle length: 9 cm and 9 Needle insertion depth: 9 cm Catheter type: closed end flexible Catheter size: 19 Gauge Catheter at skin depth: 14 cm Test dose: negative and Other  Assessment Events: blood not aspirated, injection not painful, no injection resistance, negative IV test and no paresthesia  Additional Notes Patient identified. Risks and benefits discussed including failed block, incomplete  Pain control, post dural puncture headache, nerve damage, paralysis, blood pressure Changes, nausea, vomiting, reactions to medications-both toxic and allergic and post Partum back pain. All questions were answered. Patient expressed understanding and wished to proceed. Sterile technique was used throughout procedure. Epidural site was Dressed with sterile barrier dressing. No paresthesias, signs of intravascular injection Or signs of intrathecal spread were encountered.  Patient was more comfortable after the epidural was dosed. Please see RN's note for documentation of vital signs and FHR which are stable.

## 2013-10-22 ENCOUNTER — Encounter (HOSPITAL_COMMUNITY): Payer: Self-pay

## 2013-10-22 LAB — CBC
HCT: 29.7 % — ABNORMAL LOW (ref 36.0–46.0)
HCT: 35.4 % — ABNORMAL LOW (ref 36.0–46.0)
Hemoglobin: 12.3 g/dL (ref 12.0–15.0)
MCH: 30.1 pg (ref 26.0–34.0)
MCH: 30.6 pg (ref 26.0–34.0)
MCHC: 34 g/dL (ref 30.0–36.0)
MCHC: 34.7 g/dL (ref 30.0–36.0)
MCV: 88.1 fL (ref 78.0–100.0)
MCV: 88.4 fL (ref 78.0–100.0)
Platelets: 176 10*3/uL (ref 150–400)
RDW: 14.9 % (ref 11.5–15.5)
RDW: 15.1 % (ref 11.5–15.5)
WBC: 16.7 10*3/uL — ABNORMAL HIGH (ref 4.0–10.5)

## 2013-10-22 NOTE — Lactation Note (Signed)
This note was copied from the chart of [name] Guinevere Stephenson. Lactation Consultation Note Mom states breast feeding is going well; c/o sore nipples at start of feeding which resolves within a few seconds. Mom states she does hear swallows when baby is breast feeding. Reviewed the importance of a deep latch with each feeding to protect the nipples. Enc mom to call the lactation office if she has any concerns. Lactation number provided, enc mom to make a follow up appt if nipple soreness does not resolve. Enc mom to attend the [facility].  Patient Name: [name] Curtis [id number] Date: 10/22/2013 Reason for consult: Follow-up assessment   Maternal Data    Feeding    LATCH Score/Interventions                      Lactation Tools Discussed/Used     Consult Status Consult Status: PRN    Lenard Forth 10/22/2013, 11:02 AM

## 2013-10-22 NOTE — Lactation Note (Signed)
This note was copied from the chart of [name] Daleena Rotter. Lactation Consultation Note    Follow up consult with this mom and term baby, now 40 hours old, and taken to the NICU with hypoglycemia a few hours ago. I started mom pumping with a DEP, in the premie setting, and hsowed her how to hand express. She was able to collect a few  Drops of colostrum. Basic teaching on pumping done from the NICU booklet on providing EBM.   I assisted mom with positioing hte baby in football hold, but he was too sleppy to even wake up. I helped mom hand express some colostrum drops into his mouth. I will follow this mom and baby in the NICU. Mom knows to call for questions/concerns.  Patient Name: [name] Curtis ZOXWR'U Date: 10/22/2013     Maternal Data    Feeding    LATCH Score/Interventions                      Lactation Tools Discussed/Used     Consult Status      [name] Curtis 10/22/2013, 4:58 PM

## 2013-10-22 NOTE — Progress Notes (Signed)
Post Partum Day 1 Subjective: no complaints, up ad lib, voiding and tolerating PO  Objective: Blood pressure 134/82, pulse 90, temperature 98.6 F (37 C), temperature source Oral, resp. rate 20, height 5\' 2"  (1.575 m), weight 236 lb (107.049 kg), SpO2 94.00%, unknown if currently breastfeeding.  Physical Exam:  General: alert and cooperative Lochia: appropriate Uterine Fundus: firm Incision: perineum intact DVT Evaluation: No evidence of DVT seen on physical exam. Negative Homan's sign. No cords or calf tenderness.   Recent Labs  10/22/13 0001 10/22/13 0545  HGB 12.3 10.1*  HCT 35.4* 29.7*    Assessment/Plan: Plan for discharge tomorrow and Circumcision prior to discharge   LOS: 3 days   Reannon Candella G 10/22/2013, 7:43 AM

## 2013-10-23 LAB — COMPREHENSIVE METABOLIC PANEL
ALT: 20 U/L (ref 0–35)
Albumin: 2.6 g/dL — ABNORMAL LOW (ref 3.5–5.2)
Alkaline Phosphatase: 107 U/L (ref 39–117)
CO2: 23 mEq/L (ref 19–32)
Calcium: 9.3 mg/dL (ref 8.4–10.5)
Creatinine, Ser: 0.74 mg/dL (ref 0.50–1.10)
GFR calc Af Amer: >90 mL/min (ref >90)
Potassium: 3.6 mEq/L (ref 3.5–5.1)
Sodium: 139 mEq/L (ref 135–145)
Total Bilirubin: 0.4 mg/dL (ref 0.3–1.2)
Total Protein: 6.2 g/dL (ref 6.0–8.3)

## 2013-10-23 LAB — CBC
MCH: 30.2 pg (ref 26.0–34.0)
MCHC: 34 g/dL (ref 30.0–36.0)
Platelets: 170 10*3/uL (ref 150–400)
RDW: 15.2 % (ref 11.5–15.5)

## 2013-10-23 MED ORDER — LABETALOL HCL 100 MG PO TABS
100.0000 mg | ORAL_TABLET | Freq: Three times a day (TID) | ORAL | Status: DC
  Administered 2013-10-23 (×3): 100 mg via ORAL
  Filled 2013-10-23 (×4): qty 1

## 2013-10-23 NOTE — Lactation Note (Signed)
This note was copied from the chart of [name] Joan Herschberger. Lactation Consultation Note    Follow up consult with this mom and term baby, in NICU. He is now 38 hours post partum, and has been very sleepy at the breast. I assisted mom with cross cradle hold . He would suckle for a minute or two, and then fall asleep. I used a 10 ml syringe with a cut 5 french ng as an SNS, and  he suckled at the breast and took  1.5 ml's of colostrum and 6 mls of formula. I will work with mom and baby  later today.  Patient Name: [name] Curtis ZOXWR'U Date: 10/23/2013 Reason for consult: Follow-up assessment;NICU baby   Maternal Data    Feeding Feeding Type: Breast Fed Length of feed: 15 min (on and off for 30 minutes)  LATCH Score/Interventions Latch: Repeated attempts needed to sustain latch, nipple held in mouth throughout feeding, stimulation needed to elicit sucking reflex. Intervention(s): Skin to skin;Teach feeding cues;Waking techniques Intervention(s): Assist with latch;Breast compression  Audible Swallowing: None Intervention(s): Hand expression Intervention(s): Skin to skin  Type of Nipple: Everted at rest and after stimulation Intervention(s): Hand pump  Comfort (Breast/Nipple): Soft / non-tender     Hold (Positioning): Assistance needed to correctly position infant at breast and maintain latch. Intervention(s): Breastfeeding basics reviewed;Support Pillows;Position options;Skin to skin  LATCH Score: 6  Lactation Tools Discussed/Used     Consult Status Consult Status: Follow-up Date: 10/24/13    Alfred Levins 10/23/2013, 12:57 PM

## 2013-10-23 NOTE — Progress Notes (Signed)
Post Partum Day 2 Subjective: no complaints, up ad lib, voiding, tolerating PO, + flatus and denies HA or RUQ pain. Reports that she had been on Labetalol at the end of her pregnancy, Dose of Labetalol had been 300 mg tid  Objective: Blood pressure 160/92, pulse 93, temperature 97.5 F (36.4 C), temperature source Oral, resp. rate 20, height 5\' 2"  (1.575 m), weight 236 lb (107.049 kg), SpO2 97.00%, unknown if currently breastfeeding.  Physical Exam:  General: alert and cooperative Lochia: appropriate Uterine Fundus: firm Incision: perineum intact DVT Evaluation: No evidence of DVT seen on physical exam. Negative Homan's sign. No cords or calf tenderness. Calf/Ankle edema is present. DTR's 2+ no clonus   Recent Labs  10/22/13 0001 10/22/13 0545  HGB 12.3 10.1*  HCT 35.4* 29.7*    Assessment/Plan: CHTN, will repeat labs and restart Labetalol, careful observation of BP.   LOS: 4 days   CURTIS,CAROL G 10/23/2013, 8:02 AM

## 2013-10-23 NOTE — Anesthesia Postprocedure Evaluation (Signed)
  Anesthesia Post-op Note  Patient: [name] Curtis  Procedure(s) Performed: * No procedures listed *  Patient Location: PACU and Mother/Baby  Anesthesia Type:Epidural  Level of Consciousness: awake, alert , oriented and patient cooperative  Airway and Oxygen Therapy: Patient Spontanous Breathing  Post-op Pain: none  Post-op Assessment: Post-op Vital signs reviewed, Patient's Cardiovascular Status Stable and Respiratory Function Stable  Post-op Vital Signs: Reviewed and stable  Complications: No apparent anesthesia complications

## 2013-10-23 NOTE — Plan of Care (Signed)
Problem: Discharge Progression Outcomes Goal: Complications resolved/controlled Outcome: Progressing Monitoring BP's and administrating blood pressure medication as ordered.

## 2013-10-24 ENCOUNTER — Ambulatory Visit: Payer: Self-pay

## 2013-10-24 MED ORDER — LABETALOL HCL 200 MG PO TABS: 200.0000 mg | ORAL_TABLET | Freq: Three times a day (TID) | ORAL | Status: DC

## 2013-10-24 MED ORDER — IBUPROFEN 600 MG PO TABS: 600.0000 mg | ORAL_TABLET | Freq: Four times a day (QID) | ORAL | Status: DC

## 2013-10-24 MED ORDER — LABETALOL HCL 200 MG PO TABS
200.0000 mg | ORAL_TABLET | Freq: Three times a day (TID) | ORAL | Status: DC
  Administered 2013-10-24: 200 mg via ORAL
  Filled 2013-10-24: qty 1

## 2013-10-24 NOTE — Lactation Note (Signed)
This note was copied from the chart of [name] Curtis. Lactation Consultation Note     Follow up consult with this mom of a NICU baby, now at 60 hours post partum, and being discharged to home today.   Mom sad to be leaving without baby. On exam, her milk is transitioning in. I advised mom to pump every 3 hours, until she stops dripping, to rest and stay hydrated. I will follow this family in the NICU. Mom knows to call for questions/concerns. She has a DEP at home.  Patient Name: [name] Jonathan Kirkendoll NFAOZ'H Date: 10/24/2013     Maternal Data    Feeding Feeding Type: Formula Nipple Type: Slow - flow Length of feed: 25 min  LATCH Score/Interventions                      Lactation Tools Discussed/Used     Consult Status      Alfred Levins 10/24/2013, 4:18 PM

## 2013-10-24 NOTE — Discharge Summary (Signed)
Obstetric Discharge Summary Reason for Admission: induction of labor Prenatal Procedures: ultrasound Intrapartum Procedures: spontaneous vaginal delivery Postpartum Procedures: none Complications-Operative and Postpartum: 2 degree perineal laceration Hemoglobin  Date Value Range Status  10/23/2013 9.7* 12.0 - 15.0 g/dL Final     HCT  Date Value Range Status  10/23/2013 28.5* 36.0 - 46.0 % Final    Physical Exam:  General: alert and cooperative, baby transferred to NICU yesterday for hypoglycemia Lochia: appropriate Uterine Fundus: firm Incision: perineum intact DVT Evaluation: No evidence of DVT seen on physical exam. Negative Homan's sign. No cords or calf tenderness.  Discharge Diagnoses: Term Pregnancy-delivered and chtn  Discharge Information: Date: 10/24/2013 Activity: pelvic rest Diet: routine Medications: PNV, Ibuprofen and labetalol Condition: stable Instructions: refer to practice specific booklet Discharge to: home   Newborn Data: Live born female  Birth Weight: 6 lb 8.4 oz (2960 g) APGAR: 9, 9  Home with nicu.  Kieron Kantner G 10/24/2013, 7:58 AM

## 2014-05-07 ENCOUNTER — Emergency Department (HOSPITAL_BASED_OUTPATIENT_CLINIC_OR_DEPARTMENT_OTHER)
Admission: EM | Admit: 2014-05-07 | Discharge: 2014-05-07 | Disposition: A | Discharge status: Home / Self Care | Payer: 59 | Attending: Emergency Medicine | Admitting: Emergency Medicine

## 2014-05-07 ENCOUNTER — Encounter (HOSPITAL_BASED_OUTPATIENT_CLINIC_OR_DEPARTMENT_OTHER): Payer: Self-pay | Admitting: Emergency Medicine

## 2014-05-07 ENCOUNTER — Emergency Department (HOSPITAL_BASED_OUTPATIENT_CLINIC_OR_DEPARTMENT_OTHER): Payer: 59

## 2014-05-07 DIAGNOSIS — Z791 Long term (current) use of non-steroidal anti-inflammatories (NSAID): Secondary | ICD-10-CM | POA: Insufficient documentation

## 2014-05-07 DIAGNOSIS — Z79899 Other long term (current) drug therapy: Secondary | ICD-10-CM | POA: Insufficient documentation

## 2014-05-07 DIAGNOSIS — A088 Other specified intestinal infections: Secondary | ICD-10-CM | POA: Insufficient documentation

## 2014-05-07 DIAGNOSIS — R110 Nausea: Secondary | ICD-10-CM | POA: Insufficient documentation

## 2014-05-07 DIAGNOSIS — Z8669 Personal history of other diseases of the nervous system and sense organs: Secondary | ICD-10-CM | POA: Insufficient documentation

## 2014-05-07 DIAGNOSIS — E039 Hypothyroidism, unspecified: Secondary | ICD-10-CM | POA: Insufficient documentation

## 2014-05-07 DIAGNOSIS — K529 Noninfective gastroenteritis and colitis, unspecified: Secondary | ICD-10-CM

## 2014-05-07 DIAGNOSIS — IMO0001 Reserved for inherently not codable concepts without codable children: Secondary | ICD-10-CM | POA: Insufficient documentation

## 2014-05-07 DIAGNOSIS — R6883 Chills (without fever): Secondary | ICD-10-CM | POA: Insufficient documentation

## 2014-05-07 DIAGNOSIS — I10 Essential (primary) hypertension: Secondary | ICD-10-CM | POA: Insufficient documentation

## 2014-05-07 HISTORY — DX: Essential (primary) hypertension: I10

## 2014-05-07 LAB — COMPREHENSIVE METABOLIC PANEL
ALBUMIN: 4 g/dL (ref 3.5–5.2)
ALT: 142 U/L — ABNORMAL HIGH (ref 0–35)
AST: 140 U/L — ABNORMAL HIGH (ref 0–37)
Alkaline Phosphatase: 78 U/L (ref 39–117)
BUN: 14 mg/dL (ref 6–23)
CHLORIDE: 97 meq/L (ref 96–112)
CO2: 24 mEq/L (ref 19–32)
CREATININE: 1 mg/dL (ref 0.50–1.10)
Calcium: 9.3 mg/dL (ref 8.4–10.5)
GFR calc Af Amer: 84 mL/min — ABNORMAL LOW (ref >90)
GFR calc non Af Amer: 73 mL/min — ABNORMAL LOW (ref >90)
GLUCOSE: 129 mg/dL — AB (ref 70–99)
POTASSIUM: 3.6 meq/L — AB (ref 3.7–5.3)
Sodium: 137 mEq/L (ref 137–147)
Total Bilirubin: 0.8 mg/dL (ref 0.3–1.2)
Total Protein: 8.2 g/dL (ref 6.0–8.3)

## 2014-05-07 LAB — CBC WITH DIFFERENTIAL/PLATELET
BASOS PCT: 0 % (ref 0–1)
Basophils Absolute: 0 10*3/uL (ref 0.0–0.1)
EOS ABS: 0.1 10*3/uL (ref 0.0–0.7)
Eosinophils Relative: 1 % (ref 0–5)
HEMATOCRIT: 41.8 % (ref 36.0–46.0)
Hemoglobin: 14.2 g/dL (ref 12.0–15.0)
LYMPHS ABS: 1.8 10*3/uL (ref 0.7–4.0)
Lymphocytes Relative: 20 % (ref 12–46)
MCH: 29 pg (ref 26.0–34.0)
MCHC: 34 g/dL (ref 30.0–36.0)
MCV: 85.5 fL (ref 78.0–100.0)
MONO ABS: 0.9 10*3/uL (ref 0.1–1.0)
MONOS PCT: 10 % (ref 3–12)
NEUTROS ABS: 6.5 10*3/uL (ref 1.7–7.7)
NEUTROS PCT: 70 % (ref 43–77)
Platelets: 231 10*3/uL (ref 150–400)
RBC: 4.89 MIL/uL (ref 3.87–5.11)
RDW: 13.5 % (ref 11.5–15.5)
WBC: 9.3 10*3/uL (ref 4.0–10.5)

## 2014-05-07 LAB — I-STAT CHEM 8, ED
BUN: 12 mg/dL (ref 6–23)
Calcium, Ion: 1.14 mmol/L (ref 1.12–1.23)
Chloride: 101 mEq/L (ref 96–112)
Creatinine, Ser: 0.9 mg/dL (ref 0.50–1.10)
Glucose, Bld: 118 mg/dL — ABNORMAL HIGH (ref 70–99)
HEMATOCRIT: 41 % (ref 36.0–46.0)
Hemoglobin: 13.9 g/dL (ref 12.0–15.0)
POTASSIUM: 3.3 meq/L — AB (ref 3.7–5.3)
SODIUM: 139 meq/L (ref 137–147)
TCO2: 22 mmol/L (ref 0–100)

## 2014-05-07 MED ORDER — SODIUM CHLORIDE 0.9 % IV SOLN
Freq: Once | INTRAVENOUS | Status: AC
  Administered 2014-05-07: 09:00:00 via INTRAVENOUS

## 2014-05-07 MED ORDER — ONDANSETRON HCL 4 MG/2ML IJ SOLN
4.0000 mg | Freq: Once | INTRAMUSCULAR | Status: AC
  Administered 2014-05-07: 4 mg via INTRAVENOUS
  Filled 2014-05-07: qty 2

## 2014-05-07 MED ORDER — ONDANSETRON 8 MG PO TBDP
ORAL_TABLET | ORAL | Status: DC
Start: 2014-05-07 — End: 2015-08-13

## 2014-05-07 MED ORDER — KETOROLAC TROMETHAMINE 30 MG/ML IJ SOLN
30.0000 mg | Freq: Once | INTRAMUSCULAR | Status: AC
  Administered 2014-05-07: 30 mg via INTRAVENOUS
  Filled 2014-05-07: qty 1

## 2014-05-07 NOTE — ED Provider Notes (Signed)
CSN: [id number]     Arrival date & time 05/07/14  0804 History   First MD Initiated Contact with Patient 05/07/14 [phone]     Chief Complaint  Patient presents with  . Cough  . fevers at home   . Chills     (Consider location/radiation/quality/duration/timing/severity/associated sxs/prior Treatment) HPI Comments: Patient is a 33 year-old female otherwise healthy who presents with complaints of nausea, vomiting, diarrhea for the past several days. 2 other family members have been ill in a similar fashion. She reports fevers to 102 yesterday. She denies any abdominal pain, bloody stool, or difficulty breathing.  Patient is a 33 y.o. female presenting with diarrhea. The history is provided by the patient.  Diarrhea Quality:  Watery Severity:  Moderate Onset quality:  Sudden Duration:  3 days Timing:  Constant Progression:  Worsening Relieved by:  Nothing Worsened by:  Nothing tried Ineffective treatments: Phenergan. Associated symptoms: chills, myalgias and vomiting   Associated symptoms: no abdominal pain, no fever and no URI     Past Medical History  Diagnosis Date  . Hypothyroidism   . Migraine headache   . Hypothyroidism   . Hypertension    History reviewed. No pertinent past surgical history. Family History  Problem Relation Age of Onset  . Hypertension Mother    History  Substance Use Topics  . Smoking status: Never Smoker   . Smokeless tobacco: Not on file  . Alcohol Use: No   OB History   Grav Para Term Preterm Abortions TAB SAB Ect Mult Living   3 1 1  2 2    1      Review of Systems  Constitutional: Positive for chills. Negative for fever.  Gastrointestinal: Positive for vomiting and diarrhea. Negative for abdominal pain.  Musculoskeletal: Positive for myalgias.  All other systems reviewed and are negative.     Allergies  Review of patient's allergies indicates no known allergies.  Home Medications   Prior to Admission medications   Medication Sig  Start Date End Date Taking? Authorizing Provider  ibuprofen (ADVIL,MOTRIN) 600 MG tablet Take 1 tablet (600 mg total) by mouth every 6 (six) hours. 10/24/13   Judith Blonderarol G Curtis, NP  labetalol (NORMODYNE) 200 MG tablet Take 1 tablet (200 mg total) by mouth 3 (three) times daily. 10/24/13   Judith Blonderarol G Curtis, NP  levothyroxine (SYNTHROID, LEVOTHROID) 50 MCG tablet Take 50 mcg by mouth daily.    Historical Provider, MD  Prenatal Vit-Fe Fumarate-FA (PRENATAL MULTIVITAMIN) TABS tablet Take 1 tablet by mouth daily at 12 noon.    Historical Provider, MD   BP 128/85  Pulse 100  Temp(Src) 98.9 F (37.2 C) (Oral)  Resp 24  Ht 5\' 2"  (1.575 m)  Wt 242 lb (109.77 kg)  BMI 44.25 kg/m2  SpO2 98%  LMP 05/04/2014 Physical Exam  Nursing note and vitals reviewed. Constitutional: She is oriented to person, place, and time. She appears well-developed and well-nourished. No distress.  HENT:  Head: Normocephalic and atraumatic.  Mouth/Throat: Oropharynx is clear and moist.  Neck: Normal range of motion. Neck supple.  Cardiovascular: Normal rate and regular rhythm.  Exam reveals no gallop and no friction rub.   No murmur heard. Pulmonary/Chest: Effort normal and breath sounds normal. No respiratory distress. She has no wheezes.  Abdominal: Soft. Bowel sounds are normal. She exhibits no distension. There is no tenderness.  Musculoskeletal: Normal range of motion. She exhibits no edema.  Neurological: She is alert and oriented to person, place, and time.  Skin: Skin is warm and dry. She is not diaphoretic.    ED Course  Procedures (including critical care time) Labs Review Labs Reviewed  CBC WITH DIFFERENTIAL  COMPREHENSIVE METABOLIC PANEL    Imaging Review No results found.   EKG Interpretation None      MDM   Final diagnoses:  None    Patient is a 33 year old female with history of hypertension and hypothyroidism. She presents with complaints of nausea, vomiting, and diarrhea for the past 2  days. Her husband and child has been ill in a similar fashion. Her presentation, exam, and workup are consistent with a viral gastroenteritis. She is feeling better with IV fluids and medications in the ER and I feel as though she is appropriate for discharge. She did have a mild elevation of her transaminases. An ultrasound was obtained to rule out a gallbladder component. This showed no stones and no evidence for cholecystitis. I suspect the mild elevation in liver functions is related to her taking Tylenol for her fever for the past several days.    Geoffery Lyonsouglas Mylz Yuan, MD 05/07/14 1151

## 2014-05-07 NOTE — ED Notes (Signed)
Patient transported to Ultrasound 

## 2014-05-07 NOTE — Discharge Instructions (Signed)
Zofran as prescribed as needed for nausea.  Clear liquid diet for the next 24 hours, then slowly advance your diet to normal.  Return to the emergency department if he develops severe abdominal pain, high fever, bloody stool, or any other new and concerning symptoms.   Viral Gastroenteritis Viral gastroenteritis is also known as stomach flu. This condition affects the stomach and intestinal tract. It can cause sudden diarrhea and vomiting. The illness typically lasts 3 to 8 days. Most people develop an immune response that eventually gets rid of the virus. While this natural response develops, the virus can make you quite ill. CAUSES  Many different viruses can cause gastroenteritis, such as rotavirus or noroviruses. You can catch one of these viruses by consuming contaminated food or water. You may also catch a virus by sharing utensils or other personal items with an infected person or by touching a contaminated surface. SYMPTOMS  The most common symptoms are diarrhea and vomiting. These problems can cause a severe loss of body fluids (dehydration) and a body salt (electrolyte) imbalance. Other symptoms may include:  Fever.  Headache.  Fatigue.  Abdominal pain. DIAGNOSIS  Your caregiver can usually diagnose viral gastroenteritis based on your symptoms and a physical exam. A stool sample may also be taken to test for the presence of viruses or other infections. TREATMENT  This illness typically goes away on its own. Treatments are aimed at rehydration. The most serious cases of viral gastroenteritis involve vomiting so severely that you are not able to keep fluids down. In these cases, fluids must be given through an intravenous line (IV). HOME CARE INSTRUCTIONS   Drink enough fluids to keep your urine clear or pale yellow. Drink small amounts of fluids frequently and increase the amounts as tolerated.  Ask your caregiver for specific rehydration instructions.  Avoid:  Foods high in  sugar.  Alcohol.  Carbonated drinks.  Tobacco.  Juice.  Caffeine drinks.  Extremely hot or cold fluids.  Fatty, greasy foods.  Too much intake of anything at one time.  Dairy products until 24 to 48 hours after diarrhea stops.  You may consume probiotics. Probiotics are active cultures of beneficial bacteria. They may lessen the amount and number of diarrheal stools in adults. Probiotics can be found in yogurt with active cultures and in supplements.  Wash your hands well to avoid spreading the virus.  Only take over-the-counter or prescription medicines for pain, discomfort, or fever as directed by your caregiver. Do not give aspirin to children. Antidiarrheal medicines are not recommended.  Ask your caregiver if you should continue to take your regular prescribed and over-the-counter medicines.  Keep all follow-up appointments as directed by your caregiver. SEEK IMMEDIATE MEDICAL CARE IF:   You are unable to keep fluids down.  You do not urinate at least once every 6 to 8 hours.  You develop shortness of breath.  You notice blood in your stool or vomit. This may look like coffee grounds.  You have abdominal pain that increases or is concentrated in one small area (localized).  You have persistent vomiting or diarrhea.  You have a fever.  The patient is a child younger than 3 months, and he or she has a fever.  The patient is a child older than 3 months, and he or she has a fever and persistent symptoms.  The patient is a child older than 3 months, and he or she has a fever and symptoms suddenly get worse.  The patient is  a baby, and he or she has no tears when crying. MAKE SURE YOU:   Understand these instructions.  Will watch your condition.  Will get help right away if you are not doing well or get worse. Document Released: 12/05/2005 Document Revised: 02/27/2012 Document Reviewed: 09/21/2011 [facility] Patient Information 2014 [place].

## 2014-05-07 NOTE — ED Notes (Signed)
Pt amb to room 5 with slow, steady gait in nad. Pt reports her son had "a stomach bug" last week, " and I caught it on Monday". Pt states her husband also had the same sx. Pt reports onset of vomiting, fevers and body aches with chills, and some diarrhea, which has since cleared up with immodium. Pt reports last emesis last night. Pt has kept down apple juice and water so far this am. Pt states she was seen at urgent care on Monday, was given rx for phenergan, states "it just made me more nauseous."

## 2014-09-18 IMAGING — US US FETAL BPP W/O NONSTRESS
1 series · 12 of 12 positions shown · non-contrast
Comparison: none

[Series 1: us fetal bpp w/o nonstress · non-contrast · 12 acquisitions, 12 frames shown]
[im 1/12]
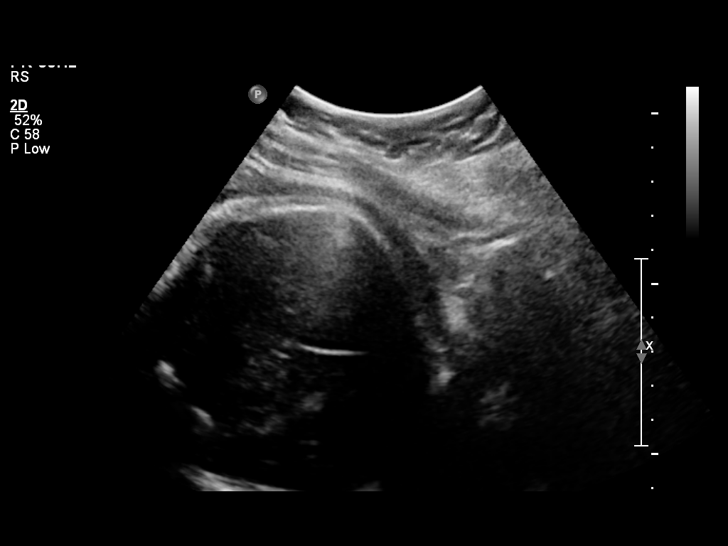
[im 2/12]
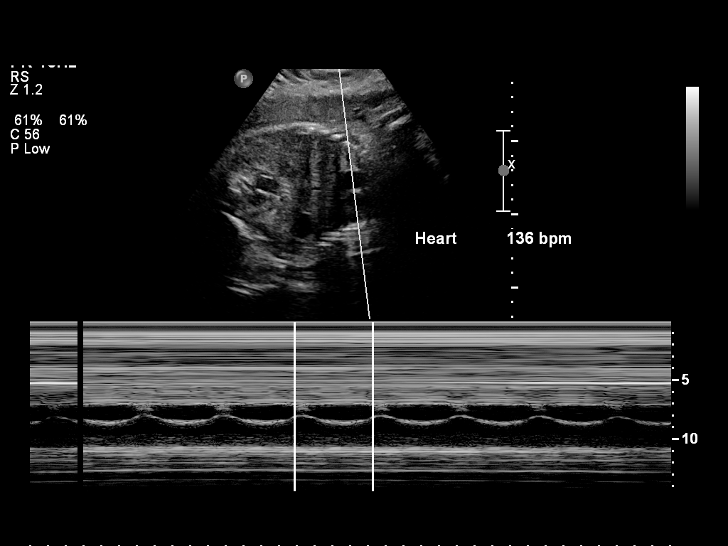
[im 3/12]
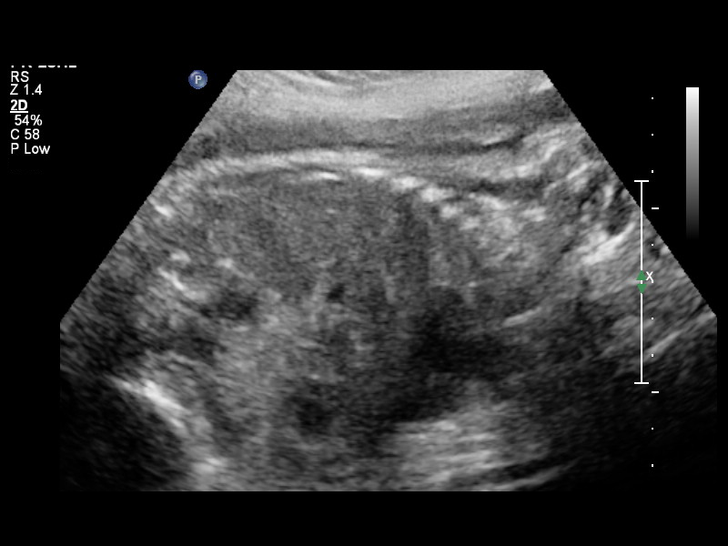
[im 4/12]
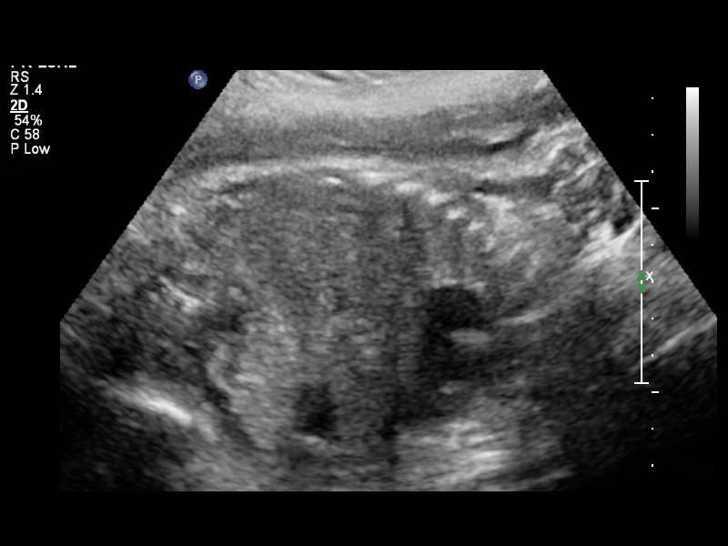
[im 5/12]
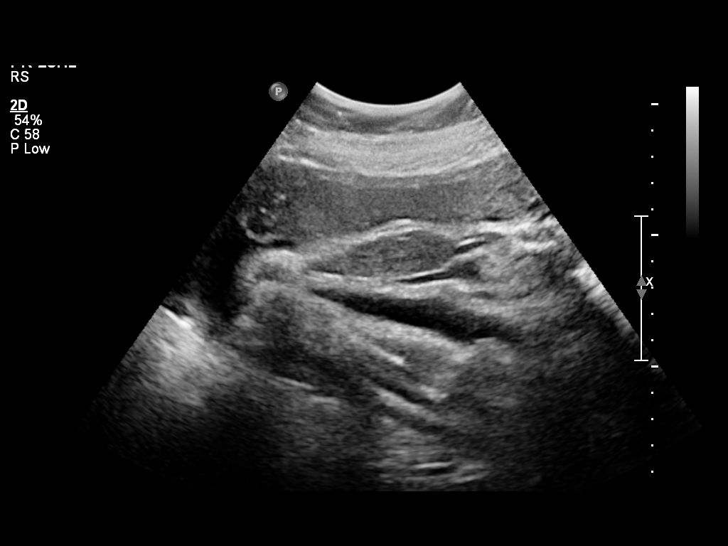
[im 6/12]
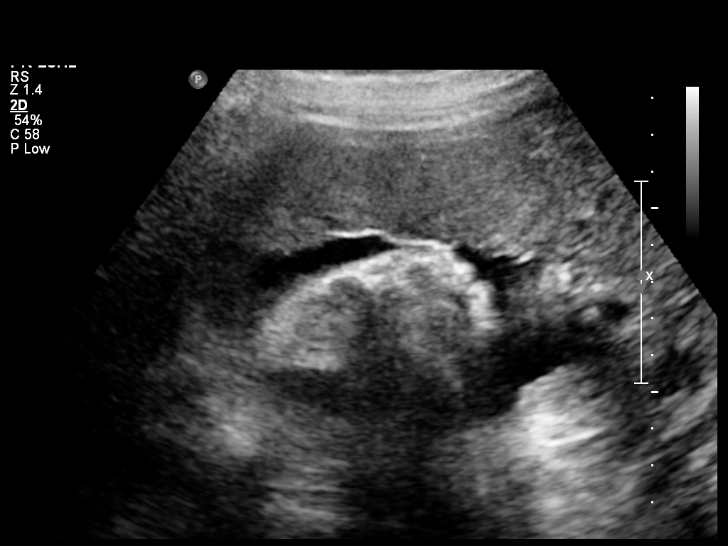
[im 7/12]
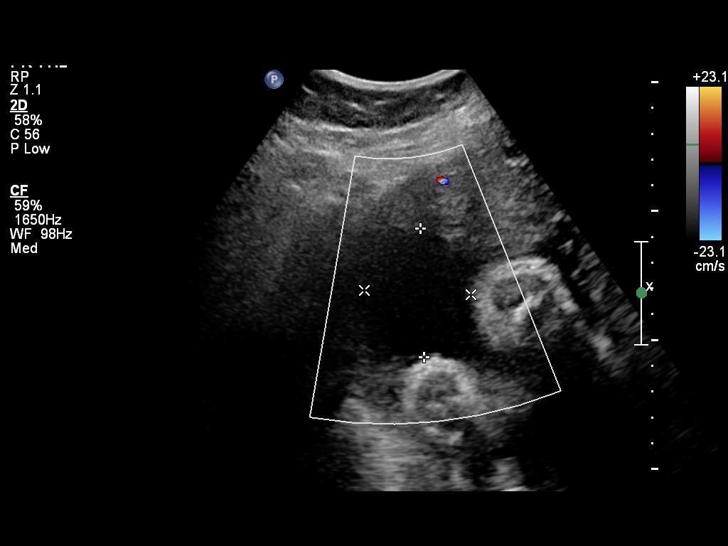
[im 8/12]
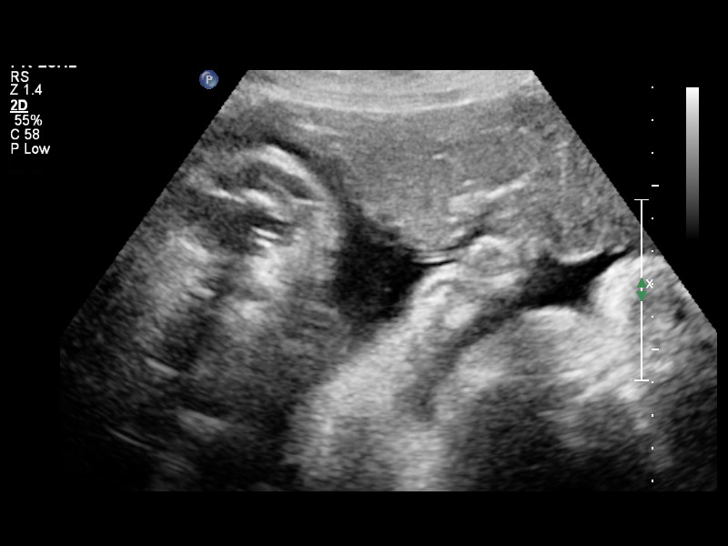
[im 9/12]
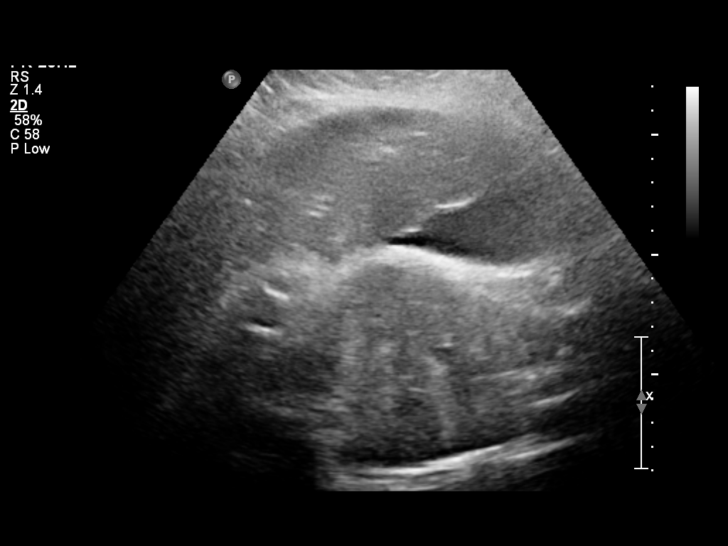
[im 10/12]
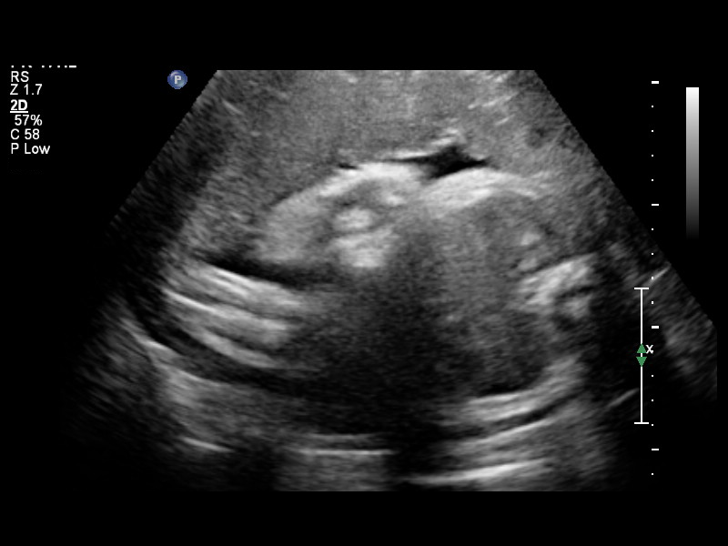
[im 11/12]
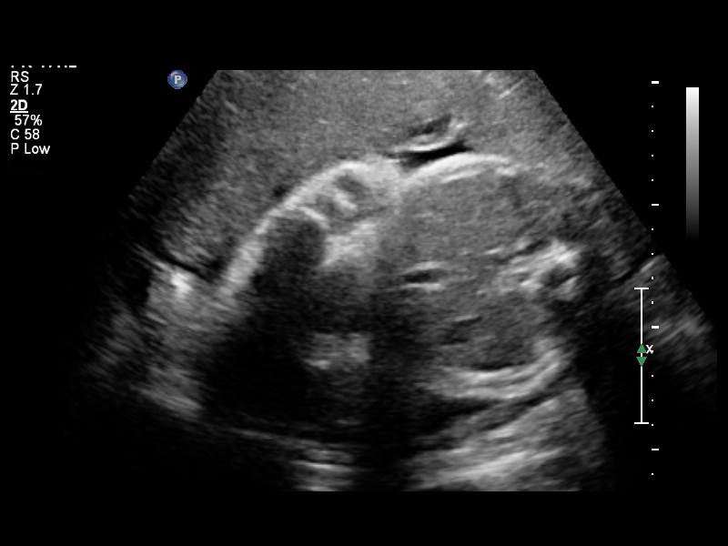
[im 12/12]
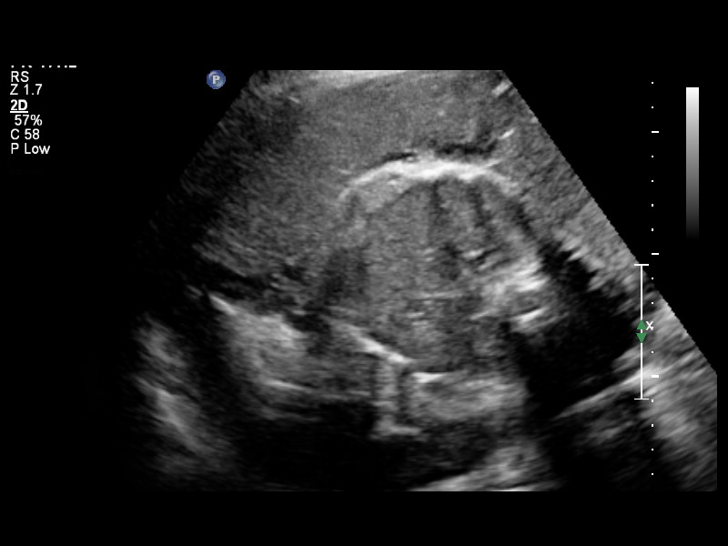

[12 of 12 positions shown; findings below may reference images not displayed]

OBSTETRICS REPORT
                      (Signed Final 09/24/2013 [DATE])

Service(s) Provided

Indications

 Hypertension - Gestational
 Obesity complicating pregnancy                        649.13,
Fetal Evaluation

 Num Of Fetuses:    1
 Fetal Heart Rate:  136                          bpm
 Cardiac Activity:  Observed
 Presentation:      Cephalic

 Comment:    BPP time=23 minutes

 Amniotic Fluid
 AFI FV:      Subjectively within normal limits
                                             Larg Pckt:     5.0  cm
Biophysical Evaluation

 Amniotic F.V:   Pocket => 2 cm two         F. Tone:        Observed
                 planes
 F. Movement:    Observed                   Score:          [DATE]
 F. Breathing:   Observed
Gestational Age

 Clinical EDD:  34w 3d                                        EDD:   11/02/13
 Best:          34w 3d     Det. By:  Clinical EDD             EDD:   11/02/13
Impression

 Single living intrauterine pregnancy at 34 weeks 3 days.
 Normal amniotic fluid volume.
 BPP [DATE].
Recommendations

 Follow-up ultrasounds as clinically indicated.
 questions or concerns.
                [place], [place]

## 2014-10-20 ENCOUNTER — Encounter (HOSPITAL_BASED_OUTPATIENT_CLINIC_OR_DEPARTMENT_OTHER): Payer: Self-pay | Admitting: Emergency Medicine

## 2015-06-13 ENCOUNTER — Encounter (HOSPITAL_COMMUNITY): Payer: Self-pay | Admitting: *Deleted

## 2015-06-13 DIAGNOSIS — Z3A28 28 weeks gestation of pregnancy: Secondary | ICD-10-CM | POA: Diagnosis not present

## 2015-06-13 DIAGNOSIS — S76111A Strain of right quadriceps muscle, fascia and tendon, initial encounter: Secondary | ICD-10-CM | POA: Diagnosis not present

## 2015-06-13 DIAGNOSIS — O99283 Endocrine, nutritional and metabolic diseases complicating pregnancy, third trimester: Secondary | ICD-10-CM | POA: Insufficient documentation

## 2015-06-13 DIAGNOSIS — O9A213 Injury, poisoning and certain other consequences of external causes complicating pregnancy, third trimester: Secondary | ICD-10-CM | POA: Insufficient documentation

## 2015-06-13 DIAGNOSIS — Y999 Unspecified external cause status: Secondary | ICD-10-CM | POA: Insufficient documentation

## 2015-06-13 DIAGNOSIS — Z79899 Other long term (current) drug therapy: Secondary | ICD-10-CM | POA: Insufficient documentation

## 2015-06-13 DIAGNOSIS — O9989 Other specified diseases and conditions complicating pregnancy, childbirth and the puerperium: Secondary | ICD-10-CM | POA: Diagnosis not present

## 2015-06-13 DIAGNOSIS — E039 Hypothyroidism, unspecified: Secondary | ICD-10-CM | POA: Diagnosis not present

## 2015-06-13 DIAGNOSIS — Y939 Activity, unspecified: Secondary | ICD-10-CM | POA: Insufficient documentation

## 2015-06-13 DIAGNOSIS — X58XXXA Exposure to other specified factors, initial encounter: Secondary | ICD-10-CM | POA: Diagnosis not present

## 2015-06-13 DIAGNOSIS — Y929 Unspecified place or not applicable: Secondary | ICD-10-CM | POA: Insufficient documentation

## 2015-06-13 DIAGNOSIS — O10913 Unspecified pre-existing hypertension complicating pregnancy, third trimester: Secondary | ICD-10-CM | POA: Insufficient documentation

## 2015-06-13 DIAGNOSIS — O24419 Gestational diabetes mellitus in pregnancy, unspecified control: Secondary | ICD-10-CM | POA: Insufficient documentation

## 2015-06-13 DIAGNOSIS — R1030 Lower abdominal pain, unspecified: Secondary | ICD-10-CM | POA: Insufficient documentation

## 2015-06-13 NOTE — ED Notes (Signed)
The pt is 28weeks preg was just diagnosed with gestational diabetes this weel and hypertension due to her pregnancy.  She woke up this am with lt leg pain that moved to her rt leg from the knee up to her rt hip.  Now she has difficulty walking  edc  Sept  18th

## 2015-06-14 ENCOUNTER — Emergency Department (HOSPITAL_COMMUNITY)
Admission: EM | Admit: 2015-06-14 | Discharge: 2015-06-14 | Disposition: A | Discharge status: Home / Self Care | Payer: 59 | Attending: Emergency Medicine | Admitting: Emergency Medicine

## 2015-06-14 DIAGNOSIS — S76111A Strain of right quadriceps muscle, fascia and tendon, initial encounter: Secondary | ICD-10-CM

## 2015-06-14 MED ORDER — ACETAMINOPHEN 325 MG PO TABS
650.0000 mg | ORAL_TABLET | Freq: Once | ORAL | Status: AC
  Administered 2015-06-14: 650 mg via ORAL
  Filled 2015-06-14: qty 2

## 2015-06-14 MED ORDER — ACETAMINOPHEN ER 650 MG PO TBCR: 650.0000 mg | EXTENDED_RELEASE_TABLET | Freq: Three times a day (TID) | ORAL | Status: DC | PRN

## 2015-06-14 NOTE — Discharge Instructions (Signed)
We suspect that you have muscle strain or possibly a small tear. Use the crutches - partial weight bearing and tylenol for pain. Rest as much as possible, and see the doctor as requested.   Groin Strain A groin strain (also called a groin pull) is an injury to the muscles or tendon on the upper inner part of the thigh. These muscles are called the adductor muscles or groin muscles. They are responsible for moving the leg across the body. A muscle strain occurs when a muscle is overstretched and some muscle fibers are torn. A groin strain can range from mild to severe depending on how many muscle fibers are affected and whether the muscle fibers are partially or completely torn.  Groin strains usually occur during exercise or participation in sports. The injury often happens when a sudden, violent force is placed on a muscle, stretching the muscle too far. A strain is more likely to occur when your muscles are not warmed up or if you are not properly conditioned. Depending on the severity of the groin strain, recovery time may vary from a few weeks to several weeks. Severe injuries often require 4-6 weeks for recovery. In these cases, complete healing can take 4-5 months.  CAUSES   Stretching the groin muscles too far or too suddenly, often during side-to-side motion with an abrupt change in direction.  Putting repeated stress on the groin muscles over a long period of time.  Performing vigorous activity without properly stretching the groin muscles beforehand. SYMPTOMS   Pain and tenderness in the groin area. This begins as sharp pain and persists as a dull ache.  Popping or snapping feeling when the injury occurs (for severe strains).  Swelling or bruising.  Muscle spasms.  Weakness in the leg.  Stiffness in the groin area with decreased ability to move the affected muscles. DIAGNOSIS  Your caregiver will perform a physical exam to diagnose a groin strain. You will be asked about your  symptoms and how the injury occurred. X-rays are sometimes needed to rule out a broken bone or cartilage problems. Your caregiver may order a CT scan or MRI if a complete muscle tear is suspected. TREATMENT  A groin strain will often heal on its own. Your caregiver may prescribe medicines to help manage pain and swelling (anti-inflammatory medicine). You may be told to use crutches for the first few days to minimize your pain. HOME CARE INSTRUCTIONS   Rest. Do not use the strained muscle if it causes pain.  Put ice on the injured area.  Put ice in a plastic bag.  Place a towel between your skin and the bag.  Leave the ice on for 15-20 minutes, every 2-3 hours. Do this for the first 2 days after the injury.  Only take over-the-counter or prescription medicines as directed by your caregiver.  Wrap the injured area with an elastic bandage as directed by your caregiver.  Keep the injured leg raised (elevated).  Walk, stretch, and perform range-of-motion exercises to improve blood flow to the injured area. Only perform these activities if you can do so without any pain. To prevent muscle strains:  Warm up before exercise.  Develop proper conditioning and strength in the groin muscles. SEEK IMMEDIATE MEDICAL CARE IF:   You have increased pain or swelling in the affected area.   Your symptoms are not improving or are getting worse. MAKE SURE YOU:   Understand these instructions.  Will watch your condition.  Will get help right  away if you are not doing well or get worse. Document Released: 08/02/2004 Document Revised: 11/21/2012 Document Reviewed: 08/08/2012 [facility] Patient Information 2015 [place], [place]. This information is not intended to replace advice given to you by your health care provider. Make sure you discuss any questions you have with your health care provider.

## 2015-06-14 NOTE — ED Notes (Signed)
MD at bedside. 

## 2015-06-14 NOTE — ED Provider Notes (Signed)
CSN: [id number]     Arrival date & time 06/13/15  2312 History   None    This chart was scribed for [name] Kaplan, MD by Arlan Organ, ED Scribe. This patient was seen in room A02C/A02C and the patient's care was started 1:00 AM.   Chief Complaint  Patient presents with  . Leg Pain   The history is provided by the patient. No language interpreter was used.    HPI Comments: [name] Curtis, [id number] currently [redacted] weeks gestation is a 34 y.o. female who presents to the Emergency Department complaining of constant, ongoing, progressively worsening L leg pain onset early this morning. Pt states she jumped up out of her bed this morning after noticing some bilateral leg cramping. Pain is significantly exacerbated with weight bearing and ambulation. No alleviating factors at this time. OTC Tylenol attempted prior to arrival with mild improvement for discomfort. No recent fever or chills. No loss of sensation or numbness to the leg. PMHx includes newly diagnosed gestational diabetes and HTN. No known allergies to medications.   Past Medical History  Diagnosis Date  . Hypothyroidism   . Migraine headache   . Hypothyroidism   . Hypertension    History reviewed. No pertinent past surgical history. Family History  Problem Relation Age of Onset  . Hypertension Mother    History  Substance Use Topics  . Smoking status: Never Smoker   . Smokeless tobacco: Not on file  . Alcohol Use: No   OB History    Gravida Para Term Preterm AB TAB SAB Ectopic Multiple Living   Review of Systems  Constitutional: Negative for fever and chills.  Respiratory: Negative for shortness of breath.   Cardiovascular: Negative for chest pain.  Gastrointestinal: Negative for vomiting and abdominal pain.  Musculoskeletal: Positive for arthralgias.  All other systems reviewed and are negative.     Allergies  Review of patient's allergies indicates no known allergies.  Home Medications    Prior to Admission medications   Medication Sig Start Date End Date Taking? Authorizing Provider  acetaminophen (TYLENOL 8 HOUR) 650 MG CR tablet Take 1 tablet (650 mg total) by mouth every 8 (eight) hours as needed for pain. 06/14/15   [name] Kaplan, MD  ibuprofen (ADVIL,MOTRIN) 600 MG tablet Take 1 tablet (600 mg total) by mouth every 6 (six) hours. 10/24/13   Julio Sicks, NP  labetalol (NORMODYNE) 200 MG tablet Take 1 tablet (200 mg total) by mouth 3 (three) times daily. 10/24/13   Julio Sicks, NP  levothyroxine (SYNTHROID, LEVOTHROID) 50 MCG tablet Take 50 mcg by mouth daily.    Historical Provider, MD  ondansetron (ZOFRAN ODT) 8 MG disintegrating tablet  ODT q4 hours prn nausea 05/07/14   Geoffery Lyons, MD  Prenatal Vit-Fe Fumarate-FA (PRENATAL MULTIVITAMIN) TABS tablet Take 1 tablet by mouth daily at 12 noon.    Historical Provider, MD   Triage Vitals: BP 108/70 mmHg  Pulse 82  Temp(Src) 97.6 F (36.4 C) (Oral)  Resp 16  SpO2 96%  LMP    Physical Exam  Constitutional: She is oriented to person, place, and time. She appears well-developed and well-nourished. No distress.  HENT:  Head: Normocephalic and atraumatic.  Eyes: EOM are normal.  Neck: Normal range of motion.  Cardiovascular: Normal rate, regular rhythm and normal heart sounds.   Pulses:      Dorsalis pedis pulses are 1+ on the right side.  Pulmonary/Chest: Effort normal and breath sounds normal.  Abdominal: Soft. She exhibits no distension. There is no tenderness.  Musculoskeletal: Normal range of motion. She exhibits tenderness.  RIGHT LOWER EXTREMITY: Medial proximal thigh tenderness Proximal lateral thigh tenderness Mild swelling to the anterior thigh  Tenderness with passive leg raise Poor active leg raise secondary to pain Pt unable to flex knee secondary to pain No ecchymosis appreciated on thigh Knee appears to be normal No gross deformity noted Skin warm to touch  Neurological: She is alert and  oriented to person, place, and time.  Skin: Skin is warm and dry.  Psychiatric: She has a normal mood and affect. Judgment normal.  Nursing note and vitals reviewed.   ED Course  Procedures (including critical care time)  DIAGNOSTIC STUDIES: Oxygen Saturation is 98% on RA, Normal by my interpretation.    COORDINATION OF CARE: 1:10 AM- Will give Tylenol. Encouraged OTC Tylenol use at home along with ice and heat application. Discussed treatment plan with pt at bedside and pt agreed to plan.     Labs Review Labs Reviewed - No data to display  Imaging Review No results found.   EKG Interpretation None      MDM   Final diagnoses:  Quadriceps strain, right, initial encounter    I personally performed the services described in this documentation, which was scribed in my presence. The recorded information has been reviewed and is accurate.  Pt comes in with groin pain. R sided, sx started after she "jumped" in the morning due to cramp. She has had some pain in her Rt groin since then, getting worse with ambulation and any movement. No pop heard - no signs of complete tear. Pain appears to be at the quads/hif flexors, and pt's ROM is severely reduced. No dislocation, fracture suspected.  With pt being pregnant, will give her rx strength tylenol, advise rest and give crutches for partial weight bearing. Sports med f/u given.  [name] Kaplan, MD 06/14/15 [phone]

## 2015-06-17 ENCOUNTER — Encounter: Payer: 59 | Attending: Obstetrics and Gynecology

## 2015-06-17 VITALS — Ht 62.0 in | Wt 219.2 lb

## 2015-06-17 DIAGNOSIS — Z713 Dietary counseling and surveillance: Secondary | ICD-10-CM | POA: Insufficient documentation

## 2015-06-17 DIAGNOSIS — O24410 Gestational diabetes mellitus in pregnancy, diet controlled: Secondary | ICD-10-CM | POA: Diagnosis not present

## 2015-06-19 NOTE — Progress Notes (Signed)
  Patient was seen on 06/17/15 for Gestational Diabetes self-management class at the [facility]. The following learning objectives were met by the patient during this course:   States the definition of Gestational Diabetes  States why dietary management is important in controlling blood glucose  Describes the effects each nutrient has on blood glucose levels  Demonstrates ability to create a balanced meal plan  Demonstrates carbohydrate counting   States when to check blood glucose levels  Demonstrates proper blood glucose monitoring techniques  States the effect of stress and exercise on blood glucose levels  States the importance of limiting caffeine and abstaining from alcohol and smoking  Blood glucose monitor given:  One Touch Verioflex Self Monitoring Kit Lot # [id number] x Exp: 08/2016 Blood glucose reading: 89 mg/dl  Patient instructed to monitor glucose levels: FBS: 60 - <90 2 hour: <120  *Patient received handouts:  Nutrition Diabetes and Pregnancy  Carbohydrate Counting List  Patient will be seen for follow-up as needed.

## 2015-08-10 ENCOUNTER — Encounter (HOSPITAL_COMMUNITY): Payer: Self-pay | Admitting: Student

## 2015-08-10 ENCOUNTER — Encounter (HOSPITAL_COMMUNITY)
Admission: AD | Disposition: A | Discharge status: Home / Self Care | Payer: Self-pay | Source: Ambulatory Visit | Attending: Obstetrics and Gynecology

## 2015-08-10 ENCOUNTER — Inpatient Hospital Stay (HOSPITAL_COMMUNITY)
Admission: AD | Admit: 2015-08-10 | Discharge: 2015-08-13 | DRG: 765 | Disposition: A | Discharge status: Home / Self Care | Payer: 59 | Source: Ambulatory Visit | Attending: Obstetrics and Gynecology | Admitting: Obstetrics and Gynecology

## 2015-08-10 ENCOUNTER — Inpatient Hospital Stay (HOSPITAL_COMMUNITY): Payer: 59 | Admitting: Anesthesiology

## 2015-08-10 DIAGNOSIS — O99354 Diseases of the nervous system complicating childbirth: Secondary | ICD-10-CM | POA: Diagnosis present

## 2015-08-10 DIAGNOSIS — O322XX0 Maternal care for transverse and oblique lie, not applicable or unspecified: Principal | ICD-10-CM | POA: Diagnosis present

## 2015-08-10 DIAGNOSIS — O09523 Supervision of elderly multigravida, third trimester: Secondary | ICD-10-CM | POA: Diagnosis not present

## 2015-08-10 DIAGNOSIS — O24420 Gestational diabetes mellitus in childbirth, diet controlled: Secondary | ICD-10-CM | POA: Diagnosis present

## 2015-08-10 DIAGNOSIS — O99284 Endocrine, nutritional and metabolic diseases complicating childbirth: Secondary | ICD-10-CM | POA: Diagnosis present

## 2015-08-10 DIAGNOSIS — G43909 Migraine, unspecified, not intractable, without status migrainosus: Secondary | ICD-10-CM | POA: Diagnosis present

## 2015-08-10 DIAGNOSIS — O1092 Unspecified pre-existing hypertension complicating childbirth: Secondary | ICD-10-CM | POA: Diagnosis present

## 2015-08-10 DIAGNOSIS — O139 Gestational [pregnancy-induced] hypertension without significant proteinuria, unspecified trimester: Secondary | ICD-10-CM | POA: Diagnosis present

## 2015-08-10 DIAGNOSIS — O133 Gestational [pregnancy-induced] hypertension without significant proteinuria, third trimester: Secondary | ICD-10-CM | POA: Diagnosis present

## 2015-08-10 DIAGNOSIS — Z3A36 36 weeks gestation of pregnancy: Secondary | ICD-10-CM | POA: Diagnosis present

## 2015-08-10 DIAGNOSIS — Z8249 Family history of ischemic heart disease and other diseases of the circulatory system: Secondary | ICD-10-CM | POA: Diagnosis not present

## 2015-08-10 HISTORY — DX: Gestational diabetes mellitus in pregnancy, unspecified control: O24.419

## 2015-08-10 HISTORY — DX: Gestational (pregnancy-induced) hypertension without significant proteinuria, unspecified trimester: O13.9

## 2015-08-10 LAB — COMPREHENSIVE METABOLIC PANEL
ALT: 16 U/L (ref 14–54)
AST: 24 U/L (ref 15–41)
Albumin: 2.8 g/dL — ABNORMAL LOW (ref 3.5–5.0)
Alkaline Phosphatase: 137 U/L — ABNORMAL HIGH (ref 38–126)
Anion gap: 10 (ref 5–15)
BUN: <5 mg/dL — ABNORMAL LOW (ref 6–20)
CALCIUM: 9.4 mg/dL (ref 8.9–10.3)
CO2: 20 mmol/L — AB (ref 22–32)
Chloride: 108 mmol/L (ref 101–111)
Creatinine, Ser: 0.53 mg/dL (ref 0.44–1.00)
GFR calc Af Amer: >60 mL/min (ref >60)
GFR calc non Af Amer: >60 mL/min (ref >60)
Glucose, Bld: 56 mg/dL — ABNORMAL LOW (ref 65–99)
Potassium: 3.8 mmol/L (ref 3.5–5.1)
SODIUM: 138 mmol/L (ref 135–145)
Total Bilirubin: 0.8 mg/dL (ref 0.3–1.2)
Total Protein: 6.7 g/dL (ref 6.5–8.1)

## 2015-08-10 LAB — GLUCOSE, CAPILLARY
GLUCOSE-CAPILLARY: 68 mg/dL (ref 65–99)
Glucose-Capillary: 55 mg/dL — ABNORMAL LOW (ref 65–99)
Glucose-Capillary: 86 mg/dL (ref 65–99)

## 2015-08-10 LAB — URINALYSIS, ROUTINE W REFLEX MICROSCOPIC
Bilirubin Urine: NEGATIVE
Glucose, UA: NEGATIVE mg/dL
Hgb urine dipstick: NEGATIVE
KETONES UR: NEGATIVE mg/dL
LEUKOCYTES UA: NEGATIVE
NITRITE: NEGATIVE
PH: 6.5 (ref 5.0–8.0)
Protein, ur: NEGATIVE mg/dL
Specific Gravity, Urine: 1.015 (ref 1.005–1.030)
Urobilinogen, UA: 0.2 mg/dL (ref 0.0–1.0)

## 2015-08-10 LAB — CBC
HCT: 37.2 % (ref 36.0–46.0)
Hemoglobin: 12.4 g/dL (ref 12.0–15.0)
MCH: 29.2 pg (ref 26.0–34.0)
MCHC: 33.3 g/dL (ref 30.0–36.0)
MCV: 87.7 fL (ref 78.0–100.0)
Platelets: 189 10*3/uL (ref 150–400)
RBC: 4.24 MIL/uL (ref 3.87–5.11)
RDW: 15.3 % (ref 11.5–15.5)
WBC: 11.2 10*3/uL — ABNORMAL HIGH (ref 4.0–10.5)

## 2015-08-10 LAB — TYPE AND SCREEN
ABO/RH(D): A POS
Antibody Screen: NEGATIVE

## 2015-08-10 LAB — PROTEIN / CREATININE RATIO, URINE
Creatinine, Urine: 39.01 mg/dL
Protein Creatinine Ratio: 0.33 mg/mg{Cre} — ABNORMAL HIGH (ref 0.00–0.15)
Total Protein, Urine: 13 mg/dL

## 2015-08-10 LAB — LACTATE DEHYDROGENASE: LDH: 171 U/L (ref 98–192)

## 2015-08-10 LAB — URIC ACID: Uric Acid, Serum: 2.8 mg/dL (ref 2.3–6.6)

## 2015-08-10 SURGERY — Surgical Case
Anesthesia: Spinal | Site: Abdomen

## 2015-08-10 MED ORDER — ONDANSETRON HCL 4 MG/2ML IJ SOLN
INTRAMUSCULAR | Status: AC
  Filled 2015-08-10: qty 2

## 2015-08-10 MED ORDER — DIPHENHYDRAMINE HCL 50 MG/ML IJ SOLN: 12.5000 mg | INTRAMUSCULAR | Status: DC | PRN

## 2015-08-10 MED ORDER — FLEET ENEMA 7-19 GM/118ML RE ENEM: 1.0000 | ENEMA | RECTAL | Status: DC | PRN

## 2015-08-10 MED ORDER — KETOROLAC TROMETHAMINE 30 MG/ML IJ SOLN
INTRAMUSCULAR | Status: AC
  Filled 2015-08-10: qty 1

## 2015-08-10 MED ORDER — MORPHINE SULFATE (PF) 0.5 MG/ML IJ SOLN
INTRAMUSCULAR | Status: DC | PRN
  Administered 2015-08-10: .2 mg via INTRATHECAL

## 2015-08-10 MED ORDER — LABETALOL HCL 5 MG/ML IV SOLN
20.0000 mg | INTRAVENOUS | Status: AC | PRN
  Administered 2015-08-10: 80 mg via INTRAVENOUS
  Administered 2015-08-10: 20 mg via INTRAVENOUS
  Administered 2015-08-10: 40 mg via INTRAVENOUS
  Filled 2015-08-10: qty 4
  Filled 2015-08-10: qty 8
  Filled 2015-08-10: qty 16

## 2015-08-10 MED ORDER — LACTATED RINGERS IV SOLN
INTRAVENOUS | Status: DC
  Administered 2015-08-11: 03:00:00 via INTRAVENOUS

## 2015-08-10 MED ORDER — SENNOSIDES-DOCUSATE SODIUM 8.6-50 MG PO TABS
2.0000 | ORAL_TABLET | ORAL | Status: DC
  Administered 2015-08-11: 2 via ORAL
  Filled 2015-08-10 (×3): qty 2

## 2015-08-10 MED ORDER — OXYCODONE-ACETAMINOPHEN 5-325 MG PO TABS: 1.0000 | ORAL_TABLET | ORAL | Status: DC | PRN

## 2015-08-10 MED ORDER — SCOPOLAMINE 1 MG/3DAYS TD PT72: 1.0000 | MEDICATED_PATCH | Freq: Once | TRANSDERMAL | Status: DC

## 2015-08-10 MED ORDER — NALOXONE HCL 0.4 MG/ML IJ SOLN: 0.4000 mg | INTRAMUSCULAR | Status: DC | PRN

## 2015-08-10 MED ORDER — FENTANYL CITRATE (PF) 100 MCG/2ML IJ SOLN
INTRAMUSCULAR | Status: DC | PRN
  Administered 2015-08-10: 10 ug via INTRATHECAL

## 2015-08-10 MED ORDER — ONDANSETRON HCL 4 MG/2ML IJ SOLN: 4.0000 mg | Freq: Three times a day (TID) | INTRAMUSCULAR | Status: DC | PRN

## 2015-08-10 MED ORDER — NALBUPHINE HCL 10 MG/ML IJ SOLN: 5.0000 mg | INTRAMUSCULAR | Status: DC | PRN

## 2015-08-10 MED ORDER — CEFAZOLIN SODIUM-DEXTROSE 2-3 GM-% IV SOLR: 2.0000 g | INTRAVENOUS | Status: DC

## 2015-08-10 MED ORDER — OXYTOCIN 10 UNIT/ML IJ SOLN
INTRAMUSCULAR | Status: AC
  Filled 2015-08-10: qty 4

## 2015-08-10 MED ORDER — PENICILLIN G POTASSIUM 5000000 UNITS IJ SOLR
5.0000 10*6.[IU] | Freq: Once | INTRAVENOUS | Status: AC
  Administered 2015-08-10: 5 10*6.[IU] via INTRAVENOUS
  Filled 2015-08-10: qty 5

## 2015-08-10 MED ORDER — NALBUPHINE HCL 10 MG/ML IJ SOLN: 5.0000 mg | Freq: Once | INTRAMUSCULAR | Status: DC | PRN

## 2015-08-10 MED ORDER — CITRIC ACID-SODIUM CITRATE 334-500 MG/5ML PO SOLN: 30.0000 mL | ORAL | Status: DC | PRN

## 2015-08-10 MED ORDER — KETOROLAC TROMETHAMINE 30 MG/ML IJ SOLN: 30.0000 mg | Freq: Four times a day (QID) | INTRAMUSCULAR | Status: AC | PRN

## 2015-08-10 MED ORDER — MEPERIDINE HCL 25 MG/ML IJ SOLN: 6.2500 mg | INTRAMUSCULAR | Status: DC | PRN

## 2015-08-10 MED ORDER — KETOROLAC TROMETHAMINE 30 MG/ML IJ SOLN
30.0000 mg | Freq: Four times a day (QID) | INTRAMUSCULAR | Status: AC | PRN
Start: 2015-08-10 — End: 2015-08-11
  Administered 2015-08-10: 30 mg via INTRAMUSCULAR

## 2015-08-10 MED ORDER — SODIUM CHLORIDE 0.9 % IR SOLN
Status: DC | PRN
  Administered 2015-08-10: 1000 mL

## 2015-08-10 MED ORDER — MAGNESIUM SULFATE 50 % IJ SOLN
2.0000 g/h | INTRAVENOUS | Status: DC
  Administered 2015-08-10: 4 g/h via INTRAVENOUS
  Administered 2015-08-10: 2 g/h via INTRAVENOUS
  Filled 2015-08-10: qty 80

## 2015-08-10 MED ORDER — MAGNESIUM SULFATE BOLUS VIA INFUSION
4.0000 g | Freq: Once | INTRAVENOUS | Status: DC
  Filled 2015-08-10: qty 500

## 2015-08-10 MED ORDER — LEVOTHYROXINE SODIUM 50 MCG PO TABS
50.0000 ug | ORAL_TABLET | Freq: Every day | ORAL | Status: DC
  Administered 2015-08-11 – 2015-08-13 (×3): 50 ug via ORAL
  Filled 2015-08-10 (×4): qty 1

## 2015-08-10 MED ORDER — SCOPOLAMINE 1 MG/3DAYS TD PT72
MEDICATED_PATCH | TRANSDERMAL | Status: AC
  Filled 2015-08-10: qty 1

## 2015-08-10 MED ORDER — BUTORPHANOL TARTRATE 1 MG/ML IJ SOLN: 1.0000 mg | INTRAMUSCULAR | Status: DC | PRN

## 2015-08-10 MED ORDER — TETANUS-DIPHTH-ACELL PERTUSSIS 5-2.5-18.5 LF-MCG/0.5 IM SUSP: 0.5000 mL | Freq: Once | INTRAMUSCULAR | Status: DC

## 2015-08-10 MED ORDER — DIBUCAINE 1 % RE OINT: 1.0000 "application " | TOPICAL_OINTMENT | RECTAL | Status: DC | PRN

## 2015-08-10 MED ORDER — OXYTOCIN 40 UNITS IN LACTATED RINGERS INFUSION - SIMPLE MED: 62.5000 mL/h | INTRAVENOUS | Status: AC

## 2015-08-10 MED ORDER — IBUPROFEN 600 MG PO TABS: 600.0000 mg | ORAL_TABLET | Freq: Four times a day (QID) | ORAL | Status: DC | PRN

## 2015-08-10 MED ORDER — LACTATED RINGERS IV SOLN: 500.0000 mL | INTRAVENOUS | Status: DC | PRN

## 2015-08-10 MED ORDER — DIPHENHYDRAMINE HCL 25 MG PO CAPS: 25.0000 mg | ORAL_CAPSULE | ORAL | Status: DC | PRN

## 2015-08-10 MED ORDER — LACTATED RINGERS IV SOLN
INTRAVENOUS | Status: DC | PRN
  Administered 2015-08-10 (×3): via INTRAVENOUS

## 2015-08-10 MED ORDER — SIMETHICONE 80 MG PO CHEW
80.0000 mg | CHEWABLE_TABLET | Freq: Three times a day (TID) | ORAL | Status: DC
  Administered 2015-08-11 – 2015-08-13 (×7): 80 mg via ORAL
  Filled 2015-08-10 (×7): qty 1

## 2015-08-10 MED ORDER — LACTATED RINGERS IV SOLN
INTRAVENOUS | Status: DC | PRN
  Administered 2015-08-10 (×2): via INTRAVENOUS

## 2015-08-10 MED ORDER — PHENYLEPHRINE 8 MG IN D5W 100 ML (0.08MG/ML) PREMIX OPTIME
INJECTION | INTRAVENOUS | Status: AC
  Filled 2015-08-10: qty 100

## 2015-08-10 MED ORDER — LANOLIN HYDROUS EX OINT: 1.0000 "application " | TOPICAL_OINTMENT | CUTANEOUS | Status: DC | PRN

## 2015-08-10 MED ORDER — LIDOCAINE HCL (PF) 1 % IJ SOLN: 30.0000 mL | INTRAMUSCULAR | Status: DC | PRN

## 2015-08-10 MED ORDER — CEFAZOLIN SODIUM-DEXTROSE 2-3 GM-% IV SOLR
INTRAVENOUS | Status: DC | PRN
Start: 2015-08-10 — End: 2015-08-10
  Administered 2015-08-10: 2 g via INTRAVENOUS

## 2015-08-10 MED ORDER — OXYTOCIN 40 UNITS IN LACTATED RINGERS INFUSION - SIMPLE MED
INTRAVENOUS | Status: DC | PRN
  Administered 2015-08-10: 40 [IU] via INTRAVENOUS

## 2015-08-10 MED ORDER — NALOXONE HCL 1 MG/ML IJ SOLN: 1.0000 ug/kg/h | INTRAVENOUS | Status: DC | PRN

## 2015-08-10 MED ORDER — SODIUM CHLORIDE 0.9 % IJ SOLN: 3.0000 mL | INTRAMUSCULAR | Status: DC | PRN

## 2015-08-10 MED ORDER — MENTHOL 3 MG MT LOZG
1.0000 | LOZENGE | OROMUCOSAL | Status: DC | PRN
Start: 2015-08-10 — End: 2015-08-13

## 2015-08-10 MED ORDER — DIPHENHYDRAMINE HCL 25 MG PO CAPS: 25.0000 mg | ORAL_CAPSULE | Freq: Four times a day (QID) | ORAL | Status: DC | PRN

## 2015-08-10 MED ORDER — ERYTHROMYCIN 5 MG/GM OP OINT
TOPICAL_OINTMENT | OPHTHALMIC | Status: AC
  Filled 2015-08-10: qty 1

## 2015-08-10 MED ORDER — ACETAMINOPHEN 500 MG PO TABS
1000.0000 mg | ORAL_TABLET | Freq: Four times a day (QID) | ORAL | Status: AC
  Administered 2015-08-10 – 2015-08-11 (×3): 1000 mg via ORAL
  Filled 2015-08-10 (×3): qty 2

## 2015-08-10 MED ORDER — BUPIVACAINE IN DEXTROSE 0.75-8.25 % IT SOLN
INTRATHECAL | Status: DC | PRN
  Administered 2015-08-10: 1.6 mL via INTRATHECAL

## 2015-08-10 MED ORDER — LEVOTHYROXINE SODIUM 50 MCG PO TABS
50.0000 ug | ORAL_TABLET | Freq: Every day | ORAL | Status: DC
  Filled 2015-08-10 (×2): qty 1

## 2015-08-10 MED ORDER — OXYCODONE-ACETAMINOPHEN 5-325 MG PO TABS: 2.0000 | ORAL_TABLET | ORAL | Status: DC | PRN

## 2015-08-10 MED ORDER — MORPHINE SULFATE 0.5 MG/ML IJ SOLN
INTRAMUSCULAR | Status: AC
  Filled 2015-08-10: qty 100

## 2015-08-10 MED ORDER — FENTANYL CITRATE (PF) 100 MCG/2ML IJ SOLN
INTRAMUSCULAR | Status: AC
  Filled 2015-08-10: qty 4

## 2015-08-10 MED ORDER — VITAMIN K1 1 MG/0.5ML IJ SOLN
INTRAMUSCULAR | Status: AC
Start: 2015-08-10 — End: 2015-08-11
  Filled 2015-08-10: qty 0.5

## 2015-08-10 MED ORDER — SIMETHICONE 80 MG PO CHEW
80.0000 mg | CHEWABLE_TABLET | ORAL | Status: DC | PRN
  Filled 2015-08-10: qty 1

## 2015-08-10 MED ORDER — ACETAMINOPHEN 325 MG PO TABS: 650.0000 mg | ORAL_TABLET | ORAL | Status: DC | PRN

## 2015-08-10 MED ORDER — OXYTOCIN 40 UNITS IN LACTATED RINGERS INFUSION - SIMPLE MED: 62.5000 mL/h | INTRAVENOUS | Status: DC

## 2015-08-10 MED ORDER — PHENYLEPHRINE 40 MCG/ML (10ML) SYRINGE FOR IV PUSH (FOR BLOOD PRESSURE SUPPORT)
PREFILLED_SYRINGE | INTRAVENOUS | Status: AC
  Filled 2015-08-10: qty 10

## 2015-08-10 MED ORDER — LABETALOL HCL 200 MG PO TABS
200.0000 mg | ORAL_TABLET | Freq: Three times a day (TID) | ORAL | Status: DC
  Administered 2015-08-10 – 2015-08-11 (×2): 200 mg via ORAL
  Filled 2015-08-10 (×5): qty 1

## 2015-08-10 MED ORDER — ZOLPIDEM TARTRATE 5 MG PO TABS: 5.0000 mg | ORAL_TABLET | Freq: Every evening | ORAL | Status: DC | PRN

## 2015-08-10 MED ORDER — WITCH HAZEL-GLYCERIN EX PADS: 1.0000 "application " | MEDICATED_PAD | CUTANEOUS | Status: DC | PRN

## 2015-08-10 MED ORDER — PRENATAL MULTIVITAMIN CH
1.0000 | ORAL_TABLET | Freq: Every day | ORAL | Status: DC
  Administered 2015-08-11 – 2015-08-13 (×3): 1 via ORAL
  Filled 2015-08-10 (×3): qty 1

## 2015-08-10 MED ORDER — LACTATED RINGERS IV SOLN
INTRAVENOUS | Status: DC
  Administered 2015-08-10: 14:00:00 via INTRAVENOUS

## 2015-08-10 MED ORDER — ONDANSETRON HCL 4 MG/2ML IJ SOLN
INTRAMUSCULAR | Status: DC | PRN
  Administered 2015-08-10: 4 mg via INTRAVENOUS

## 2015-08-10 MED ORDER — IBUPROFEN 600 MG PO TABS
600.0000 mg | ORAL_TABLET | Freq: Four times a day (QID) | ORAL | Status: DC
  Administered 2015-08-11 – 2015-08-13 (×10): 600 mg via ORAL
  Filled 2015-08-10 (×10): qty 1

## 2015-08-10 MED ORDER — PENICILLIN G POTASSIUM 5000000 UNITS IJ SOLR
2.5000 10*6.[IU] | INTRAVENOUS | Status: DC
  Filled 2015-08-10 (×3): qty 2.5

## 2015-08-10 MED ORDER — OXYTOCIN BOLUS FROM INFUSION: 500.0000 mL | INTRAVENOUS | Status: DC

## 2015-08-10 MED ORDER — SCOPOLAMINE 1 MG/3DAYS TD PT72
MEDICATED_PATCH | TRANSDERMAL | Status: DC | PRN
  Administered 2015-08-10: 1 via TRANSDERMAL

## 2015-08-10 MED ORDER — SIMETHICONE 80 MG PO CHEW
80.0000 mg | CHEWABLE_TABLET | ORAL | Status: DC
  Administered 2015-08-11 – 2015-08-12 (×3): 80 mg via ORAL
  Filled 2015-08-10 (×2): qty 1

## 2015-08-10 MED ORDER — ONDANSETRON HCL 4 MG/2ML IJ SOLN: 4.0000 mg | Freq: Four times a day (QID) | INTRAMUSCULAR | Status: DC | PRN

## 2015-08-10 MED ORDER — FENTANYL CITRATE (PF) 100 MCG/2ML IJ SOLN: 25.0000 ug | INTRAMUSCULAR | Status: DC | PRN

## 2015-08-10 SURGICAL SUPPLY — 33 items
BENZOIN TINCTURE PRP APPL 2/3 (GAUZE/BANDAGES/DRESSINGS) ×3 IMPLANT
CLAMP CORD UMBIL (MISCELLANEOUS) IMPLANT
CLOSURE WOUND 1/2 X4 (GAUZE/BANDAGES/DRESSINGS) ×1
CLOTH BEACON ORANGE TIMEOUT ST (SAFETY) ×3 IMPLANT
CONTAINER PREFILL 10% NBF 15ML (MISCELLANEOUS) IMPLANT
DRAPE SHEET LG 3/4 BI-LAMINATE (DRAPES) IMPLANT
DRSG OPSITE POSTOP 4X10 (GAUZE/BANDAGES/DRESSINGS) ×3 IMPLANT
DURAPREP 26ML APPLICATOR (WOUND CARE) ×3 IMPLANT
ELECT REM PT RETURN 9FT ADLT (ELECTROSURGICAL) ×3
ELECTRODE REM PT RTRN 9FT ADLT (ELECTROSURGICAL) ×1 IMPLANT
EXTRACTOR VACUUM M CUP 4 TUBE (SUCTIONS) IMPLANT
EXTRACTOR VACUUM M CUP 4' TUBE (SUCTIONS)
GLOVE BIO SURGEON STRL SZ8 (GLOVE) ×3 IMPLANT
GOWN STRL REUS W/TWL LRG LVL3 (GOWN DISPOSABLE) ×6 IMPLANT
KIT ABG SYR 3ML LUER SLIP (SYRINGE) ×3 IMPLANT
LIQUID BAND (GAUZE/BANDAGES/DRESSINGS) IMPLANT
NEEDLE HYPO 21X1.5 SAFETY (NEEDLE) ×3 IMPLANT
NEEDLE HYPO 25X5/8 SAFETYGLIDE (NEEDLE) ×3 IMPLANT
NS IRRIG 1000ML POUR BTL (IV SOLUTION) ×3 IMPLANT
PACK C SECTION WH (CUSTOM PROCEDURE TRAY) ×3 IMPLANT
PAD OB MATERNITY 4.3X12.25 (PERSONAL CARE ITEMS) ×3 IMPLANT
STRIP CLOSURE SKIN 1/2X4 (GAUZE/BANDAGES/DRESSINGS) ×2 IMPLANT
SUT MNCRL 0 VIOLET CTX 36 (SUTURE) ×4 IMPLANT
SUT MONOCRYL 0 CTX 36 (SUTURE) ×8
SUT PDS AB 0 CTX 60 (SUTURE) ×3 IMPLANT
SUT PLAIN 0 NONE (SUTURE) IMPLANT
SUT PLAIN 2 0 (SUTURE) ×2
SUT PLAIN 2 0 XLH (SUTURE) IMPLANT
SUT PLAIN ABS 2-0 CT1 27XMFL (SUTURE) ×1 IMPLANT
SUT VIC AB 4-0 KS 27 (SUTURE) ×3 IMPLANT
SYR 30ML LL (SYRINGE) ×3 IMPLANT
TOWEL OR 17X24 6PK STRL BLUE (TOWEL DISPOSABLE) ×3 IMPLANT
TRAY FOLEY CATH SILVER 14FR (SET/KITS/TRAYS/PACK) ×3 IMPLANT

## 2015-08-10 NOTE — MAU Note (Signed)
Pt here for HTN eval, denies h/a, dizziness, blurred vision. No bleeding or LOF. Was 0.5cm in office today.

## 2015-08-10 NOTE — MAU Note (Signed)
Patient was sent over from the office for HTN evaluation.  Patient is 36.[redacted]wks pregnant and has Gestational Diabetes controlled with Glyburide.  Denies LOF or vaginal bleeding.  Reports + fetal movement. Denies H/A, vision changes, and headache.

## 2015-08-10 NOTE — Brief Op Note (Signed)
08/10/2015  6:22 PM  PATIENT:  [name] Curtis  34 y.o. female  PRE-OPERATIVE DIAGNOSIS:  primary cesarean section for transverse lie  POST-OPERATIVE DIAGNOSIS:  primary cesarean section for transverse lie  PROCEDURE:  Procedure(s): CESAREAN SECTION (N/A)  SURGEON:  Surgeon(s) and Role:    * Harold Hedge, MD - Primary  PHYSICIAN ASSISTANT:   ASSISTANTS: none   ANESTHESIA:   spinal  EBL:  Total I/O In: 2000 [I.V.:2000] Out: 1300 [Urine:500; Blood:800]  BLOOD ADMINISTERED:none  DRAINS: Urinary Catheter (Foley)   LOCAL MEDICATIONS USED:  NONE  SPECIMEN:  Source of Specimen:  placenta  DISPOSITION OF SPECIMEN:  PATHOLOGY  COUNTS:  YES  TOURNIQUET:  * No tourniquets in log *  DICTATION: .Other Dictation: Dictation Number  [id number]  PLAN OF CARE: Admit to inpatient   PATIENT DISPOSITION:  PACU - hemodynamically stable.   Delay start of Pharmacological VTE agent (>24hrs) due to surgical blood loss or risk of bleeding: not applicable

## 2015-08-10 NOTE — MAU Provider Note (Signed)
History     CSN: [id number]  Arrival date and time: 08/10/15 1234   First Provider Initiated Contact with Patient 08/10/15 1406         Chief Complaint  Patient presents with  . Hypertension   HPI  [name] Curtis is a 34 y.o. G4P1021 at [redacted]w[redacted]d who presents for BP evaluation after having elevated BP in office today.  Denies headache/SOB/vision changes/chest pain/epigastric pain.  Denies vaginal bleeding/painful contractions/LOF.  Positive fetal movement.  Currently on labetalol  TID for gestational HTN; next dose due now.     Past Medical History  Diagnosis Date  . Hypothyroidism   . Migraine headache   . Hypertension   . GDM (gestational diabetes mellitus)     No past surgical history on file.  Family History  Problem Relation Age of Onset  . Hypertension Mother     Social History  Substance Use Topics  . Smoking status: Never Smoker   . Smokeless tobacco: Not on file  . Alcohol Use: No    Allergies: No Known Allergies  Prescriptions prior to admission  Medication Sig Dispense Refill Last Dose  . acetaminophen (TYLENOL 8 HOUR) 650 MG CR tablet Take 1 tablet (650 mg total) by mouth every 8 (eight) hours as needed for pain. 30 tablet 0 Taking  . ibuprofen (ADVIL,MOTRIN) 600 MG tablet Take 1 tablet (600 mg total) by mouth every 6 (six) hours. 30 tablet 1   . labetalol (NORMODYNE) 200 MG tablet Take 1 tablet (200 mg total) by mouth 3 (three) times daily. 90 tablet 3 Taking  . levothyroxine (SYNTHROID, LEVOTHROID) 50 MCG tablet Take 50 mcg by mouth daily.   Taking  . ondansetron (ZOFRAN ODT) 8 MG disintegrating tablet  ODT q4 hours prn nausea 6 tablet 0   . Prenatal Vit-Fe Fumarate-FA (PRENATAL MULTIVITAMIN) TABS tablet Take 1 tablet by mouth daily at 12 noon.   Taking    Review of Systems  Constitutional: Negative.   HENT: Negative.   Eyes: Negative.  Negative for blurred vision.  Respiratory: Negative.  Negative for shortness of breath.    Cardiovascular: Negative.  Negative for chest pain.  Gastrointestinal: Negative.  Negative for heartburn, nausea, vomiting and abdominal pain.  Genitourinary: Negative.        Negative for vaginal bleeding, LOF  Neurological: Negative for dizziness and headaches.   Physical Exam   Blood pressure 191/94, pulse 79, temperature 97.6 F (36.4 C), temperature source Oral, resp. rate 18, height  (1.575 m), weight 234 lb (106.142 kg), SpO2 97 %, unknown if currently breastfeeding.  Patient Vitals for the past 24 hrs:  BP Temp Temp src Pulse Resp SpO2 Height Weight  08/10/15 1444 163/91 mmHg - - 87 - - - -  08/10/15 1438 175/94 mmHg - - 84 - - - -  08/10/15 1430 165/94 mmHg - - 86 - - - -  08/10/15 1429 181/95 mmHg - - 78 - - - -  08/10/15 1426 (!) 173/102 mmHg - - 81 - - - -  08/10/15 1416 179/94 mmHg - - 75 - - - -  08/10/15 1400 (!) 179/105 mmHg - - 79 - - - -  08/10/15 1338 - 97.6 F (36.4 C) Oral - - - - -  08/10/15 1337 191/94 mmHg - Oral 79 18 97 %  (1.575 m) 234 lb (106.142 kg)    Physical Exam  Constitutional: She is oriented to person, place, and time. She appears well-developed and well-nourished. No distress.  Cardiovascular: Normal rate, regular rhythm and normal heart sounds.   No murmur heard. Respiratory: Effort normal and breath sounds normal. No respiratory distress. She has no wheezes.  GI: Soft. There is no tenderness.  Musculoskeletal: She exhibits edema. She exhibits no tenderness.       Right ankle: She exhibits swelling. No tenderness.       Left ankle: She exhibits swelling. No tenderness.  Neurological: She is alert and oriented to person, place, and time. She has normal reflexes.  No clonus  Skin: Skin is warm and dry. She is not diaphoretic. No erythema.  Psychiatric: She has a normal mood and affect. Her behavior is normal. Judgment and thought content normal.   Fetal Tracing:  Baseline: 125 Variability: moderate Accelerations:  15x15 Decelerations: negative  Toco: 4-6, mild   MAU Course  Procedures Results for orders placed or performed during the hospital encounter of 08/10/15 (from the past 24 hour(s))  Urinalysis, Routine w reflex microscopic (not at [facility])     Status: None   Collection Time: 08/10/15  1:40 PM  Result Value Ref Range   Color, Urine YELLOW YELLOW   APPearance CLEAR CLEAR   Specific Gravity, Urine 1.015 1.005 - 1.030   pH 6.5 5.0 - 8.0   Glucose, UA NEGATIVE NEGATIVE mg/dL   Hgb urine dipstick NEGATIVE NEGATIVE   Bilirubin Urine NEGATIVE NEGATIVE   Ketones, ur NEGATIVE NEGATIVE mg/dL   Protein, ur NEGATIVE NEGATIVE mg/dL   Urobilinogen, UA 0.2 0.0 - 1.0 mg/dL   Nitrite NEGATIVE NEGATIVE   Leukocytes, UA NEGATIVE NEGATIVE  CBC     Status: Abnormal   Collection Time: 08/10/15  2:08 PM  Result Value Ref Range   WBC 11.2 (H) 4.0 - 10.5 K/uL   RBC 4.24 3.87 - 5.11 MIL/uL   Hemoglobin 12.4 12.0 - 15.0 g/dL   HCT 78.2 95.6 - 21.3 %   MCV 87.7 78.0 - 100.0 fL   MCH 29.2 26.0 - 34.0 pg   MCHC 33.3 30.0 - 36.0 g/dL   RDW 08.6 57.8 - 46.9 %   Platelets 189 150 - 400 K/uL    MDM CBC/CMP/protein creatinine ratio/LDH/uric acid ordered Labetalol IV ordered d/t severe range BPs 1415-C/w Dr. Henderson Cloud, admit patient Assessment and Plan  A: 1. Pregnancy-induced hypertension in third trimester     P: Pt admitted to birthing suites for induction of labor per Dr. Rosamaria Lints, NP  08/10/2015, 1:59 PM

## 2015-08-10 NOTE — Transfer of Care (Signed)
Immediate Anesthesia Transfer of Care Note  Patient: [name] Curtis  Procedure(s) Performed: Procedure(s): CESAREAN SECTION (N/A)  Patient Location: PACU  Anesthesia Type:Spinal  Level of Consciousness: awake, alert , oriented and patient cooperative  Airway & Oxygen Therapy: Patient Spontanous Breathing  Post-op Assessment: Report given to RN and Post -op Vital signs reviewed and stable  Post vital signs: Reviewed and stable  Last Vitals:  BP 142/85 HR 74 RR 15 O2SAT 97 TEMP 97.1  Complications: No apparent anesthesia complications

## 2015-08-10 NOTE — Anesthesia Postprocedure Evaluation (Signed)
  Anesthesia Post-op Note  Patient: [name] Curtis  Procedure(s) Performed: Procedure(s) (LRB): CESAREAN SECTION (N/A)  Patient Location: PACU  Anesthesia Type: Spinal  Level of Consciousness: awake and alert   Airway and Oxygen Therapy: Patient Spontanous Breathing  Post-op Pain: mild  Post-op Assessment: Post-op Vital signs reviewed, Patient's Cardiovascular Status Stable, Respiratory Function Stable, Patent Airway and No signs of Nausea or vomiting  Last Vitals:  Filed Vitals:   08/10/15 1829  BP: 136/65  Pulse: 76  Temp: 36.2 C  Resp: 8    Post-op Vital Signs: stable   Complications: No apparent anesthesia complications

## 2015-08-10 NOTE — Addendum Note (Signed)
Addendum  created 08/10/15 1948 by Mal Amabile, MD   Modules edited: Orders, PRL Based Order Sets

## 2015-08-10 NOTE — H&P (Signed)
[name] Curtis is a 34 y.o. female presenting for pregnancy complicated by chronic hypertension on labetalol. BP worsening over last 2-3 weeks and labetalol dose increased last week. Labs in office have been normal for pregnancy. Today BP = 180/104 with negative protein. U/S on 08/03/15 had EFW 7# 3oz. Also GDM with good control on diet, hypothyroid on replacement and AMA declined screening. Maternal Medical History:  Fetal activity: Perceived fetal activity is normal.      OB History    Gravida Para Term Preterm AB TAB SAB Ectopic Multiple Living   Past Medical History  Diagnosis Date  . Hypothyroidism   . Migraine headache   . Hypertension   . GDM (gestational diabetes mellitus)   . Pregnancy induced hypertension   . Gestational diabetes    History reviewed. No pertinent past surgical history. Family History: family history includes Hypertension in her mother. Social History:  reports that she has never smoked. She has never used smokeless tobacco. She reports that she does not drink alcohol or use illicit drugs.   Prenatal Transfer Tool  Maternal Diabetes: Yes:  Diabetes Type:  Diet controlled Genetic Screening: Declined Maternal Ultrasounds/Referrals: Normal Fetal Ultrasounds or other Referrals:  None Maternal Substance Abuse:  No Significant Maternal Medications:  Meds include: Other: labetalol Significant Maternal Lab Results:  None Other Comments:  None  Review of Systems  Eyes: Negative for blurred vision.  Gastrointestinal: Negative for abdominal pain.  Neurological: Negative for headaches.    Dilation: Closed Effacement (%): Thick Exam by:: [name] Blood pressure 162/86, pulse 83, temperature 98.1 F (36.7 C), temperature source Oral, resp. rate 18, height  (1.575 m), weight 234 lb (106.142 kg), SpO2 97 %, unknown if currently breastfeeding. Maternal Exam:  Uterine Assessment: Contraction strength is moderate.  Contraction frequency is  regular.   Abdomen: Patient reports no abdominal tenderness. Fetal presentation: no presenting part     Fetal Exam Fetal State Assessment: Category I - tracings are normal.     Physical Exam  Cardiovascular: Normal rate and regular rhythm.   Respiratory: Effort normal and breath sounds normal.  GI: Soft. There is no tenderness.  Neurological: She has normal reflexes.    Bedside U/S - transverse lie with fetal vertex on maternal left. Cx-closed / th/ very soft with no palpable fetal part  UCs q2-4 min, moderate to palpation  Prenatal labs: ABO, Rh: --/--/A POS (08/22 1408) Antibody: PENDING (08/22 1408) Rubella:   RPR:    HBsAg:    HIV:    GBS:     Assessment/Plan: 34 yo with CHTN becoming severe, transverse lie and labor. D/W patient and husband-do not recommend version with hypertension and contractions. Recommend delivery in view of BPs-recommend cesarean section. Risks reviewed including infection, organ damage, bleeding/transfusion-HIV/Hep, DVT/PE, pneumonia.  All questions answered. Patient and husband considered the above. They state they understand and agree.   Olivette Beckmann II,Darrion Macaulay E 08/10/2015, 4:55 PM

## 2015-08-10 NOTE — Anesthesia Preprocedure Evaluation (Signed)
Anesthesia Evaluation  Patient identified by MRN, date of birth, ID band Patient awake    Reviewed: Allergy & Precautions, NPO status , Patient's Chart, lab work & pertinent test results  History of Anesthesia Complications Negative for: history of anesthetic complications  Airway Mallampati: III  TM Distance: >3 FB Neck ROM: Full    Dental no notable dental hx. (+) Dental Advisory Given   Pulmonary neg pulmonary ROS,  breath sounds clear to auscultation  Pulmonary exam normal       Cardiovascular hypertension, Pt. on medications and Pt. on home beta blockers Normal cardiovascular examRhythm:Regular Rate:Normal     Neuro/Psych  Headaches, negative psych ROS   GI/Hepatic negative GI ROS, Neg liver ROS,   Endo/Other  diabetes, GestationalHypothyroidism Morbid obesity  Renal/GU negative Renal ROS  negative genitourinary   Musculoskeletal negative musculoskeletal ROS (+)   Abdominal (+) + obese,   Peds negative pediatric ROS (+)  Hematology negative hematology ROS (+)   Anesthesia Other Findings   Reproductive/Obstetrics (+) Pregnancy                             Anesthesia Physical Anesthesia Plan  ASA: III  Anesthesia Plan: Spinal   Post-op Pain Management:    Induction:   Airway Management Planned:   Additional Equipment:   Intra-op Plan:   Post-operative Plan:   Informed Consent: I have reviewed the patients History and Physical, chart, labs and discussed the procedure including the risks, benefits and alternatives for the proposed anesthesia with the patient or authorized representative who has indicated his/her understanding and acceptance.   Dental advisory given  Plan Discussed with: CRNA  Anesthesia Plan Comments:         Anesthesia Quick Evaluation

## 2015-08-11 ENCOUNTER — Encounter (HOSPITAL_COMMUNITY): Payer: Self-pay | Admitting: *Deleted

## 2015-08-11 LAB — COMPREHENSIVE METABOLIC PANEL
ALT: 13 U/L — AB (ref 14–54)
AST: 27 U/L (ref 15–41)
Albumin: 2.5 g/dL — ABNORMAL LOW (ref 3.5–5.0)
Alkaline Phosphatase: 106 U/L (ref 38–126)
Anion gap: 6 (ref 5–15)
BILIRUBIN TOTAL: 1 mg/dL (ref 0.3–1.2)
CALCIUM: 7.6 mg/dL — AB (ref 8.9–10.3)
CO2: 22 mmol/L (ref 22–32)
CREATININE: 0.58 mg/dL (ref 0.44–1.00)
Chloride: 106 mmol/L (ref 101–111)
Glucose, Bld: 80 mg/dL (ref 65–99)
Potassium: 4.1 mmol/L (ref 3.5–5.1)
Sodium: 134 mmol/L — ABNORMAL LOW (ref 135–145)
TOTAL PROTEIN: 5.8 g/dL — AB (ref 6.5–8.1)

## 2015-08-11 LAB — CBC
HEMATOCRIT: 32.6 % — AB (ref 36.0–46.0)
HEMOGLOBIN: 10.8 g/dL — AB (ref 12.0–15.0)
MCH: 29 pg (ref 26.0–34.0)
MCHC: 33.1 g/dL (ref 30.0–36.0)
MCV: 87.6 fL (ref 78.0–100.0)
Platelets: 170 10*3/uL (ref 150–400)
RBC: 3.72 MIL/uL — AB (ref 3.87–5.11)
RDW: 15.3 % (ref 11.5–15.5)
WBC: 9.1 10*3/uL (ref 4.0–10.5)

## 2015-08-11 LAB — GLUCOSE, CAPILLARY
GLUCOSE-CAPILLARY: 146 mg/dL — AB (ref 65–99)
GLUCOSE-CAPILLARY: 125 mg/dL — AB (ref 65–99)

## 2015-08-11 LAB — RPR: RPR: NONREACTIVE

## 2015-08-11 LAB — HIV ANTIBODY (ROUTINE TESTING W REFLEX): HIV SCREEN 4TH GENERATION: NONREACTIVE

## 2015-08-11 LAB — URIC ACID: URIC ACID, SERUM: 3.1 mg/dL (ref 2.3–6.6)

## 2015-08-11 MED ORDER — SODIUM CHLORIDE 0.9 % IJ SOLN
3.0000 mL | Freq: Two times a day (BID) | INTRAMUSCULAR | Status: DC
  Administered 2015-08-11: 3 mL via INTRAVENOUS

## 2015-08-11 MED ORDER — LABETALOL HCL 100 MG PO TABS
100.0000 mg | ORAL_TABLET | Freq: Two times a day (BID) | ORAL | Status: DC
  Administered 2015-08-11: 100 mg via ORAL
  Filled 2015-08-11: qty 1

## 2015-08-11 MED ORDER — SODIUM CHLORIDE 0.9 % IJ SOLN: 3.0000 mL | INTRAMUSCULAR | Status: DC | PRN

## 2015-08-11 MED ORDER — LABETALOL HCL 100 MG PO TABS: 100.0000 mg | ORAL_TABLET | Freq: Two times a day (BID) | ORAL | Status: DC

## 2015-08-11 NOTE — Op Note (Signed)
[name], [name] NO.:  [id number]  MEDICAL RECORD NO.:  [id number]  LOCATION:  9372                          FACILITY:  [facility]  PHYSICIAN:  Guy Sandifer. Henderson Cloud, M.D. DATE OF BIRTH:  27-Apr-1981  DATE OF PROCEDURE:  08/10/2015 DATE OF DISCHARGE:                              OPERATIVE REPORT   LOCATION:  [facility], [place], [place].  PREOPERATIVE DIAGNOSES: 1. Intrauterine pregnancy at 36-4/7th weeks estimated gestational age. 2. Gestational hypertension. 3. Transverse lie. 4. Labor.  POSTOPERATIVE DIAGNOSES: 1. Acute pregnancy at April 7, 1936th weeks estimated gestational age. 2. Gestational hypertension. 3. Transverse lie. 4. Labor.  PROCEDURE:  Low-transverse cesarean section.  SURGEON:  Guy Sandifer. Henderson Cloud, M.D.  ANESTHESIA:  Spinal, Dr. Gentry Roch.  ESTIMATED BLOOD LOSS:  800 mL.  FINDINGS:  Viable female infant.  Apgars, arterial cord pH, and birth weight pending.  SPECIMENS:  Placenta to Pathology.  INDICATIONS AND CONSENT:  This patient is a 34 year old, G4, P1 at 37- 4/7 weeks.  She has been treated throughout most of pregnancy with labetalol for hypertension.  The dose has been raised and her blood pressures became much more labile over the past 1-2 weeks.  Today in the office, her blood pressure is elevated to approximately 180/104.  She is sent to [facility] for evaluation.  Blood pressures remained quite labile.  Laboratories are normal.  She has taken to the Labor and Delivery.  Blood pressure is treated with IV labetalol.  Examination revealed the cervix to be closed and thick with no presenting part palpable.  Bedside ultrasound is consistent with transverse lie.  She is contracting  every 2-4 minutes with moderate intensity.  Recommendation for delivery was made.  Cesarean section was recommended.  Potential risks and complications  were reviewed preoperatively including, but not limited to, infection,  organ damage, bleeding requiring transfusion of blood products with HIV and hepatitis acquisition, DVT, PE, or pneumonia.  All questions were answered and consent is signed on the chart.  The patient states she understands and agrees.  DESCRIPTION OF PROCEDURE:  The patient was taken to the operating room, where she was identified.  Spinal anesthetic was placed per Dr. Gentry Roch, and she was placed in a dorsal supine position with a 15-degree left lateral wedge.  Vagina was prepped.  Foley catheter was placed and the bladder was drained, and she was prepped abdominally.  She was then draped in a sterile fashion per [facility] protocol.  After testing for adequate spinal anesthesia, skin was entered through a Pfannenstiel incision.  Dissection was carried out in layers of the peritoneum.  Peritoneum was incised and extended superiorly and inferiorly.  Vesicouterine peritoneum was taken down cephalolaterally. The bladder flap was developed.  The bladder blade was placed.  Uterus was incised in a low transverse manner and the uterine cavity was entered bluntly with a hemostat.  The  uterine incision was extended cephalolaterally.  Artificial rupture of membranes for clear fluid was carried out.  The vertex was brought down to the incision and the baby was then delivered without difficulty.  The vacuum extractor was used to elevate the vertex through the skin incision without difficulty with no  pop offs and gentle traction.  Good cry and tone was noted.  The cord was clamped and cut and the baby was handed to awaiting pediatrics team. Placenta  was then manually delivered and sent to Pathology.  Uterine cavity is clean.  Uterus was closed in 2  running locking imbricating layers of 0 Monocryl suture, which achieves good hemostasis.  Anterior peritoneum was closed in a running fashion with 0 Monocryl, which was also used to reapproximate the pyramidalis muscle in midline.  Anterior rectus  fascia was closed in a running fashion with a 0 looped PDS. Subcutaneous layers were closed with interrupted plain suture and the skin was closed in a subcuticular  fashion with Vicryl on a Keith needle.  Steri-Strips were applied.  All counts were correct.  The patient was taken to recovery room in stable condition.     Guy Sandifer Henderson Cloud, M.D.     JET/MEDQ  D:  08/10/2015  T:  08/11/2015  Job:  [id number]

## 2015-08-11 NOTE — Lactation Note (Addendum)
This note was copied from the chart of [name] Curtis. Lactation Consultation Note  Patient Name: [name] Curtis Date: 08/11/2015 Reason for consult: Initial assessment;NICU baby NICU baby 47 hours old. Mom reports that her first baby was difficult to latch and she had to use a NS to latch him. Mom also states that she pumped and supplemented with EBM/Formula. Mom also states that she has been hand expressing instead of pumping with DEBP because she feels "it works better." Assisted mom to start using DEBP and adjusted pump parts until working properly. Enc mom to pump at least 8 times/24 hours for 15 minutes, followed by hand expression. Enc mom to sleep for 5 hours tonight, and then pump every 2 hours in the morning, and 2-3 throughout the day. Mom given a hand pump to use prior to latching baby in order to evert nipple. Enc mom to call ahead tomorrow for [facility] assistance with latching baby in the NICU tomorrow. Discussed mom's hypothyroidism and enc mom to follow-up with doctor as hormone levels can impact milk production/supply.  Mom given NICU booklet and LC brochure with review. Mom is aware of OP/BFSG, community resources, and [facility] phone line assistance after D/C. Mom has both a Medela and Spectra pump at home.  Maternal Data Has patient been taught Hand Expression?: Yes (Per mom.) Does the patient have breastfeeding experience prior to this delivery?: Yes  Feeding Feeding Type: Breast Milk with Formula added Nipple Type: Slow - flow Length of feed: 20 min  LATCH Score/Interventions Latch: Too sleepy or reluctant, no latch achieved, no sucking elicited. Intervention(s): Skin to skin  Audible Swallowing: None  Type of Nipple: Everted at rest and after stimulation  Comfort (Breast/Nipple): Soft / non-tender     Hold (Positioning): Assistance needed to correctly position infant at breast and maintain latch.  LATCH Score: 5  Lactation Tools Discussed/Used Pump  Review: Setup, frequency, and cleaning;Milk Storage Initiated by:: Bedside RN. Date initiated:: 08/10/15   Consult Status Consult Status: Follow-up Date: 08/12/15 Follow-up type: In-patient    Geralynn Ochs 08/11/2015, 4:08 PM

## 2015-08-11 NOTE — Progress Notes (Signed)
Pt returned from NICU to Rm #[id number]. Post prandial CBG=125 (has not transferred from POC machine to pt record).

## 2015-08-11 NOTE — Progress Notes (Addendum)
PT in NICU - belongings transferred to Rm #311. Husband notified & instructed to have her go to Rm #311 when she returns to 3rd floor, & that her CBG will be due at approx 3pm.

## 2015-08-11 NOTE — Progress Notes (Signed)
UR chart review completed.  

## 2015-08-11 NOTE — Progress Notes (Signed)
Pt back in bed after visit to NICU.  Pt tolerated well. This nurse set pt up with breast pump and provided education. Pt pumping.

## 2015-08-11 NOTE — Progress Notes (Signed)
Pt to NICU to visit infant via WC.

## 2015-08-11 NOTE — Progress Notes (Signed)
Subjective: Postpartum Day 1: Cesarean Delivery Patient reports no c/o.  Denies SOB/CP.  No HA or n/v.  Objective: Vital signs in last 24 hours: Temp:  [97.1 F (36.2 C)-98.1 F (36.7 C)] 98 F (36.7 C) (08/23 0749) Pulse Rate:  [68-92] 90 (08/23 0800) Resp:  [8-18] 18 (08/23 0749) BP: (104-191)/(55-105) 146/86 mmHg (08/23 0800) SpO2:  [95 %-98 %] 95 % (08/23 0800) Weight:  [234 lb (106.142 kg)] 234 lb (106.142 kg) (08/22 1337)  UOP:  100-300cc/hr  Physical Exam:  General: alert and cooperative Lochia: appropriate Uterine Fundus: firm Incision: no significant drainage DVT Evaluation: No evidence of DVT seen on physical exam.   Recent Labs  08/10/15 1408 08/11/15 0540  HGB 12.4 10.8*  HCT 37.2 32.6*   Labs wnl, BG 80  Assessment/Plan: Status post Cesarean section. Doing well postoperatively.  D/c magnesium, foley CHTN - continue labetalol GDM - BG stable.  Will check a few more today & then d/c if good control Routine post op care .  [name] Curtis 08/11/2015, 8:26 AM

## 2015-08-12 MED ORDER — LABETALOL HCL 200 MG PO TABS
200.0000 mg | ORAL_TABLET | Freq: Two times a day (BID) | ORAL | Status: DC
  Administered 2015-08-12 – 2015-08-13 (×2): 200 mg via ORAL
  Filled 2015-08-12: qty 1

## 2015-08-12 NOTE — Progress Notes (Signed)
Subjective: Postpartum Day 2: Cesarean Delivery Patient reports tolerating PO, + flatus and no problems voiding.  Baby stable in NICU, glucose monitoring  Objective: Vital signs in last 24 hours: Temp:  [98.1 F (36.7 C)-98.6 F (37 C)] 98.4 F (36.9 C) (08/24 0603) Pulse Rate:  [75-92] 83 (08/24 0603) Resp:  [18-19] 18 (08/24 0603) BP: (114-168)/(61-75) 133/65 mmHg (08/24 0624) SpO2:  [96 %-100 %] 98 % (08/24 0603) Weight:  [220 lb (99.791 kg)-223 lb 3.2 oz (101.243 kg)] 220 lb (99.791 kg) (08/24 0603)  Physical Exam:  General: alert and cooperative Lochia: appropriate Uterine Fundus: firm Incision: healing well DVT Evaluation: No evidence of DVT seen on physical exam. Negative Homan's sign. No cords or calf tenderness.   Recent Labs  08/10/15 1408 08/11/15 0540  HGB 12.4 10.8*  HCT 37.2 32.6*    Assessment/Plan: Status post Cesarean section. Doing well postoperatively.  Continue current care.  [name] Curtis G 08/12/2015, 8:14 AM

## 2015-08-13 ENCOUNTER — Encounter (HOSPITAL_COMMUNITY): Payer: Self-pay | Admitting: *Deleted

## 2015-08-13 ENCOUNTER — Ambulatory Visit: Payer: Self-pay

## 2015-08-13 MED ORDER — LABETALOL HCL 200 MG PO TABS: 200.0000 mg | ORAL_TABLET | Freq: Two times a day (BID) | ORAL | Status: DC

## 2015-08-13 MED ORDER — OXYCODONE-ACETAMINOPHEN 5-325 MG PO TABS: 1.0000 | ORAL_TABLET | ORAL | Status: DC | PRN

## 2015-08-13 MED ORDER — IBUPROFEN 600 MG PO TABS: 600.0000 mg | ORAL_TABLET | Freq: Four times a day (QID) | ORAL | Status: DC

## 2015-08-13 NOTE — Discharge Summary (Signed)
Obstetric Discharge Summary Reason for Admission: onset of labor Prenatal Procedures: NST and ultrasound Intrapartum Procedures: cesarean: low cervical, transverse Postpartum Procedures: none Complications-Operative and Postpartum: none HEMOGLOBIN  Date Value Ref Range Status  08/11/2015 10.8* 12.0 - 15.0 g/dL Final   HCT  Date Value Ref Range Status  08/11/2015 32.6* 36.0 - 46.0 % Final    Physical Exam:  General: alert and cooperative Lochia: appropriate Uterine Fundus: firm Incision: healing well DVT Evaluation: No evidence of DVT seen on physical exam. Negative Homan's sign. No cords or calf tenderness. Calf/Ankle edema is present.  Discharge Diagnoses: Term Pregnancy-delivered  Discharge Information: Date: 08/13/2015 Activity: pelvic rest Diet: routine Medications: PNV, Ibuprofen, Percocet and labetalol Condition: stable Instructions: refer to practice specific booklet Discharge to: home   Newborn Data: Live born female  Birth Weight: 7 lb 8.6 oz (3420 g) APGAR: 9, 9  Baby stable in NICU  Ronee Ranganathan G 08/13/2015, 8:49 AM

## 2015-08-13 NOTE — Progress Notes (Signed)
Pt ambulated out  Teaching complete 

## 2015-08-13 NOTE — Plan of Care (Signed)
Problem: Discharge Progression Outcomes Goal: Complications resolved/controlled Outcome: Progressing Continue b/p meds

## 2015-08-13 NOTE — Lactation Note (Signed)
This note was copied from the chart of [name] Brooklinn Longbottom. Lactation Consultation Note Follow up visit made prior to mother's discharge.  She is pumping every 3 hours and obtained 30 mls with last pumping.  Reminded to bring pump pieces to hospital with her when visiting NICU.  If baby is discharged this weekend I recommended she calls for an outpatient appointment for 1 week after discharge.  Mom states baby has been latching for brief periods.  Encouraged to call for assist/concerns prn.  Patient Name: [name] Curtis Today's Date: 08/13/2015     Maternal Data    Feeding    LATCH Score/Interventions                      Lactation Tools Discussed/Used     Consult Status      Huston Foley 08/13/2015, 3:46 PM

## 2015-08-18 ENCOUNTER — Encounter (HOSPITAL_COMMUNITY): Payer: Self-pay | Admitting: *Deleted

## 2015-08-18 ENCOUNTER — Inpatient Hospital Stay (HOSPITAL_COMMUNITY)
Admission: AD | Admit: 2015-08-18 | Discharge: 2015-08-18 | Disposition: A | Discharge status: Home / Self Care | Payer: 59 | Source: Ambulatory Visit | Attending: Obstetrics and Gynecology | Admitting: Obstetrics and Gynecology

## 2015-08-18 DIAGNOSIS — I158 Other secondary hypertension: Secondary | ICD-10-CM | POA: Insufficient documentation

## 2015-08-18 DIAGNOSIS — R030 Elevated blood-pressure reading, without diagnosis of hypertension: Secondary | ICD-10-CM | POA: Diagnosis present

## 2015-08-18 DIAGNOSIS — O9089 Other complications of the puerperium, not elsewhere classified: Secondary | ICD-10-CM | POA: Diagnosis not present

## 2015-08-18 DIAGNOSIS — O135 Gestational [pregnancy-induced] hypertension without significant proteinuria, complicating the puerperium: Secondary | ICD-10-CM

## 2015-08-18 DIAGNOSIS — O139 Gestational [pregnancy-induced] hypertension without significant proteinuria, unspecified trimester: Secondary | ICD-10-CM | POA: Diagnosis not present

## 2015-08-18 LAB — COMPREHENSIVE METABOLIC PANEL
ALT: 36 U/L (ref 14–54)
AST: 35 U/L (ref 15–41)
Albumin: 3.4 g/dL — ABNORMAL LOW (ref 3.5–5.0)
Alkaline Phosphatase: 104 U/L (ref 38–126)
Anion gap: 7 (ref 5–15)
BUN: 13 mg/dL (ref 6–20)
CO2: 25 mmol/L (ref 22–32)
Calcium: 8.9 mg/dL (ref 8.9–10.3)
Chloride: 103 mmol/L (ref 101–111)
Creatinine, Ser: 0.66 mg/dL (ref 0.44–1.00)
GFR calc Af Amer: >60 mL/min (ref >60)
GFR calc non Af Amer: >60 mL/min (ref >60)
Glucose, Bld: 86 mg/dL (ref 65–99)
Potassium: 3.5 mmol/L (ref 3.5–5.1)
Sodium: 135 mmol/L (ref 135–145)
Total Bilirubin: 0.6 mg/dL (ref 0.3–1.2)
Total Protein: 7.4 g/dL (ref 6.5–8.1)

## 2015-08-18 LAB — CBC
HCT: 35.8 % — ABNORMAL LOW (ref 36.0–46.0)
Hemoglobin: 11.7 g/dL — ABNORMAL LOW (ref 12.0–15.0)
MCH: 29.1 pg (ref 26.0–34.0)
MCHC: 32.7 g/dL (ref 30.0–36.0)
MCV: 89.1 fL (ref 78.0–100.0)
Platelets: 292 10*3/uL (ref 150–400)
RBC: 4.02 MIL/uL (ref 3.87–5.11)
RDW: 15 % (ref 11.5–15.5)
WBC: 8.4 10*3/uL (ref 4.0–10.5)

## 2015-08-18 LAB — PROTEIN / CREATININE RATIO, URINE
Creatinine, Urine: 157 mg/dL
Protein Creatinine Ratio: 0.09 mg/mg{Cre} (ref 0.00–0.15)
Total Protein, Urine: 14 mg/dL

## 2015-08-18 LAB — LACTATE DEHYDROGENASE: LDH: 180 U/L (ref 98–192)

## 2015-08-18 LAB — URIC ACID: Uric Acid, Serum: 4.7 mg/dL (ref 2.3–6.6)

## 2015-08-18 MED ORDER — LABETALOL HCL 100 MG PO TABS
200.0000 mg | ORAL_TABLET | Freq: Once | ORAL | Status: AC
  Administered 2015-08-18: 200 mg via ORAL
  Filled 2015-08-18: qty 2

## 2015-08-18 MED ORDER — LABETALOL HCL 300 MG PO TABS: 300.0000 mg | ORAL_TABLET | Freq: Three times a day (TID) | ORAL | Status: DC

## 2015-08-18 NOTE — MAU Provider Note (Signed)
History     CSN: [id number]  Arrival date and time: 08/18/15 1457   First Provider Initiated Contact with Patient 08/18/15 1536      Chief Complaint  Patient presents with  . Hypertension   HPI  [name] Curtis 34 y.o. [id number] presents to MAU from the office for blood pressure check. She denies headache, visual disturbances, epigastric pain. She had a C/S on 08/10/15. Induced for gestational hypertension  Past Medical History  Diagnosis Date  . Hypothyroidism   . Migraine headache   . Hypertension   . Pregnancy induced hypertension   . Gestational diabetes     Past Surgical History  Procedure Laterality Date  . Cesarean section N/A 08/10/2015    Procedure: CESAREAN SECTION;  Surgeon: Harold Hedge, MD;  Location: [facility];  Service: Obstetrics;  Laterality: N/A;    Family History  Problem Relation Age of Onset  . Hypertension Mother     Social History  Substance Use Topics  . Smoking status: Never Smoker   . Smokeless tobacco: Never Used  . Alcohol Use: No    Allergies: No Known Allergies  Prescriptions prior to admission  Medication Sig Dispense Refill Last Dose  . ibuprofen (ADVIL,MOTRIN) 600 MG tablet Take 1 tablet (600 mg total) by mouth every 6 (six) hours. 30 tablet 1   . labetalol (NORMODYNE) 200 MG tablet Take 1 tablet (200 mg total) by mouth 2 (two) times daily. 60 tablet 1   . levothyroxine (SYNTHROID, LEVOTHROID) 50 MCG tablet Take 50 mcg by mouth daily.   08/09/2015 at Unknown time  . oxyCODONE-acetaminophen (PERCOCET/ROXICET) 5-325 MG per tablet Take 1 tablet by mouth every 4 (four) hours as needed (for pain scale 4-7). 30 tablet 0   . Prenatal Vit-Fe Fumarate-FA (PRENATAL MULTIVITAMIN) TABS tablet Take 1 tablet by mouth daily at 12 noon.   08/10/2015 at Unknown time   Results for orders placed or performed during the hospital encounter of 08/18/15 (from the past 24 hour(s))  Protein / creatinine ratio, urine     Status: None   Collection Time: 08/18/15   3:10 PM  Result Value Ref Range   Creatinine, Urine 157.00 mg/dL   Total Protein, Urine 14 mg/dL   Protein Creatinine Ratio 0.09 0.00 - 0.15 mg/mg[Cre]  CBC     Status: Abnormal   Collection Time: 08/18/15  4:15 PM  Result Value Ref Range   WBC 8.4 4.0 - 10.5 K/uL   RBC 4.02 3.87 - 5.11 MIL/uL   Hemoglobin 11.7 (L) 12.0 - 15.0 g/dL   HCT 14.7 (L) 82.9 - 56.2 %   MCV 89.1 78.0 - 100.0 fL   MCH 29.1 26.0 - 34.0 pg   MCHC 32.7 30.0 - 36.0 g/dL   RDW 13.0 86.5 - 78.4 %   Platelets 292 150 - 400 K/uL  Comprehensive metabolic panel     Status: Abnormal   Collection Time: 08/18/15  4:15 PM  Result Value Ref Range   Sodium 135 135 - 145 mmol/L   Potassium 3.5 3.5 - 5.1 mmol/L   Chloride 103 101 - 111 mmol/L   CO2 25 22 - 32 mmol/L   Glucose, Bld 86 65 - 99 mg/dL   BUN 13 6 - 20 mg/dL   Creatinine, Ser 6.96 0.44 - 1.00 mg/dL   Calcium 8.9 8.9 - 29.5 mg/dL   Total Protein 7.4 6.5 - 8.1 g/dL   Albumin 3.4 (L) 3.5 - 5.0 g/dL   AST 35 15 - 41 U/L  ALT 36 14 - 54 U/L   Alkaline Phosphatase 104 38 - 126 U/L   Total Bilirubin 0.6 0.3 - 1.2 mg/dL   GFR calc non Af Amer >60 >60 mL/min   GFR calc Af Amer >60 >60 mL/min   Anion gap 7 5 - 15  Lactate dehydrogenase     Status: None   Collection Time: 08/18/15  4:15 PM  Result Value Ref Range   LDH 180 98 - 192 U/L  Uric acid     Status: None   Collection Time: 08/18/15  4:15 PM  Result Value Ref Range   Uric Acid, Serum 4.7 2.3 - 6.6 mg/dL                                                                                                                            08/18/15 1834  --  76  --  --  163/82 mmHg  --  --  --  -- SN     08/18/15 1815  --  79  --  --  164/85 mmHg  --  --  --  -- SN    08/18/15 1800  --  76  --  --  169/91 mmHg  --  --  --  -- SN    08/18/15 1745  --  79  --  --  171/87 mmHg  --  --  --  -- SN    08/18/15 1730  --  78  --  --  172/89 mmHg  --  --  --  -- SN    08/18/15 1717  --  77  --  --  177/85  mmHg  --  --  --  -- SN    08/18/15 1701  --  75  --  --  182/91 mmHg  --  --  --  -- SN    08/18/15 1646  --  79  --  --  185/90 mmHg  --  --  --  -- SN    08/18/15 1632  --  78  --  --  180/92 mmHg  --  --  --  -- SN    08/18/15 1616  --  79  --  --  172/86 mmHg  --  --  --  -- SN    08/18/15 1602  --  73  --  --  168/83 mmHg  --  --  --  -- [place]    08/18/15 1549  --  79  --  --  162/88 mmHg  --  --  --  -- [place]    08/18/15 1524  98.9 F (37.2 C)  79  --  18  160/79 mmHg  --  --  --  -- SN           B/P- most recent- 182/91 Review of Systems  Constitutional: Negative for fever.  Eyes: Negative for blurred vision.  Cardiovascular:       Elevated  blood pressure  Gastrointestinal: Negative for abdominal pain.  All other systems reviewed and are negative.  Physical Exam   Blood pressure 160/79, pulse 79, temperature 98.9 F (37.2 C), temperature source Oral, resp. rate 18, unknown if currently breastfeeding.  Physical Exam  Nursing note and vitals reviewed. Constitutional: She is oriented to person, place, and time. She appears well-developed and well-nourished. No distress.  HENT:  Head: Normocephalic.  Neck: Normal range of motion.  Cardiovascular: Normal rate.   Respiratory: Effort normal. No respiratory distress.  GI: Soft. There is no tenderness.  Musculoskeletal: Normal range of motion.  Neurological: She is alert and oriented to person, place, and time.  Skin: Skin is warm and dry.  Psychiatric: She has a normal mood and affect. Her behavior is normal. Judgment and thought content normal.    MAU Course  Procedures  MDM Serial Bp's and Pre E labs. Discussed POC with Dr Henderson Cloud. Will increase Labetolol to 300mg  TID and have her follow up in office tomorrow  Assessment and Plan  Gestational Hypertension in Postpartum Period  Rx: Labetolol 300mg  TID F/U in office tomorrow Discharge to home   [facility] 08/18/2015, 3:43 PM

## 2015-08-18 NOTE — MAU Note (Deleted)
Urine in lad

## 2015-08-18 NOTE — MAU Note (Signed)
C/s on 8/22.  Sent from dr's office, BP elevated, with headache.  Pt now states HA is about gone.  Denies visual changes or epigastric pain. This is the first time they had been out.  Baby is doing well.

## 2015-08-18 NOTE — Discharge Instructions (Signed)

## 2015-08-18 NOTE — MAU Note (Signed)
Urine in lab 

## 2021-01-04 ENCOUNTER — Ambulatory Visit: Payer: Self-pay | Admitting: Family Medicine

## 2022-04-13 ENCOUNTER — Other Ambulatory Visit: Payer: Self-pay | Admitting: Obstetrics and Gynecology

## 2022-04-13 DIAGNOSIS — R928 Other abnormal and inconclusive findings on diagnostic imaging of breast: Secondary | ICD-10-CM

## 2022-04-27 ENCOUNTER — Other Ambulatory Visit: Payer: Self-pay | Admitting: Obstetrics and Gynecology

## 2022-04-27 ENCOUNTER — Ambulatory Visit
Admission: RE | Admit: 2022-04-27 | Discharge: 2022-04-27 | Disposition: A | Discharge status: Home / Self Care | Payer: BC Managed Care – PPO | Source: Ambulatory Visit | Attending: Obstetrics and Gynecology | Admitting: Obstetrics and Gynecology

## 2022-04-27 DIAGNOSIS — R928 Other abnormal and inconclusive findings on diagnostic imaging of breast: Secondary | ICD-10-CM

## 2022-04-27 DIAGNOSIS — N6310 Unspecified lump in the right breast, unspecified quadrant: Secondary | ICD-10-CM

## 2022-04-29 ENCOUNTER — Ambulatory Visit
Admission: RE | Admit: 2022-04-29 | Discharge: 2022-04-29 | Disposition: A | Discharge status: Home / Self Care | Payer: BC Managed Care – PPO | Source: Ambulatory Visit | Attending: Obstetrics and Gynecology | Admitting: Obstetrics and Gynecology

## 2022-04-29 DIAGNOSIS — N6310 Unspecified lump in the right breast, unspecified quadrant: Secondary | ICD-10-CM

## 2023-04-23 IMAGING — MG MM BREAST LOCALIZATION CLIP
4 series · 4 of 12 positions shown · non-contrast
Comparison: Previous exam(s).

CLINICAL DATA: Evaluate post biopsy marker clip placement following
ultrasound-guided core needle biopsy of a right breast mass.

EXAM:
3D DIAGNOSTIC RIGHT MAMMOGRAM POST ULTRASOUND BIOPSY

[R ML synth-2D]
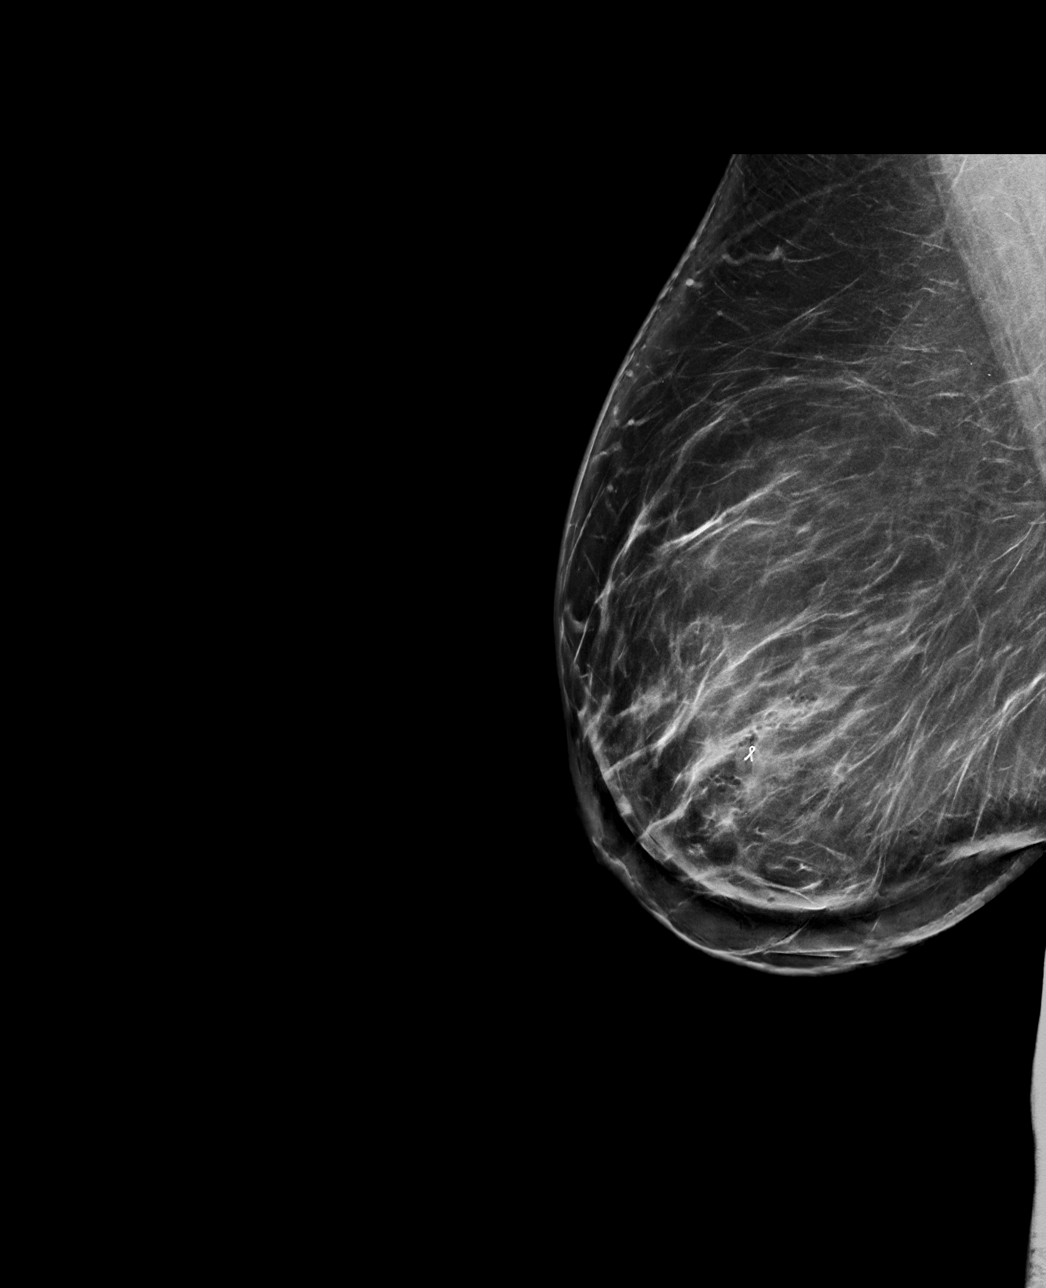

[R CC synth-2D]
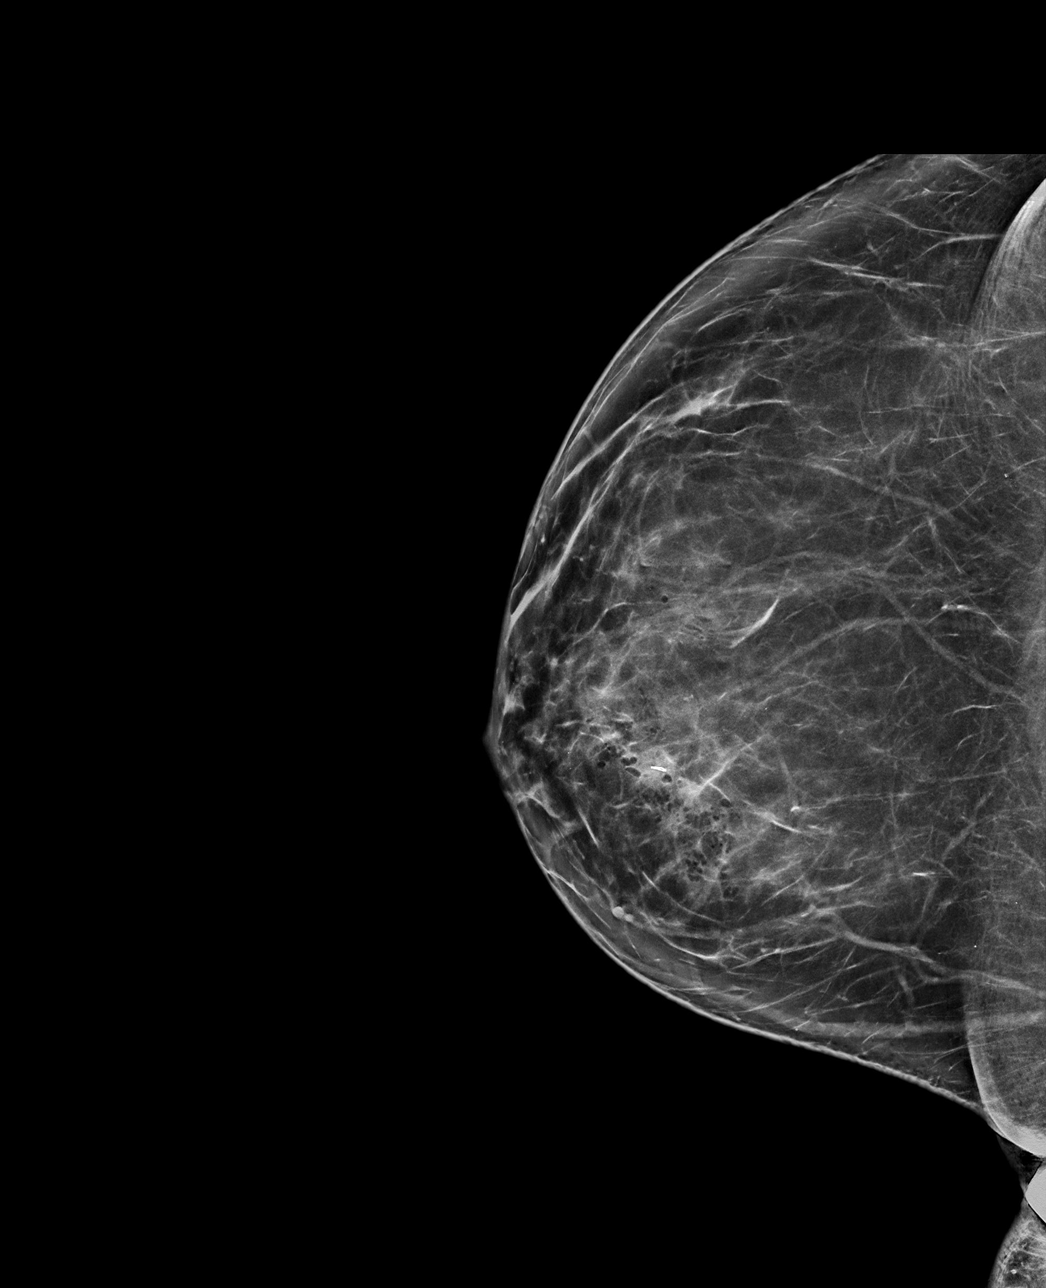

[R ML tomo · tomo slice 51/101.0]
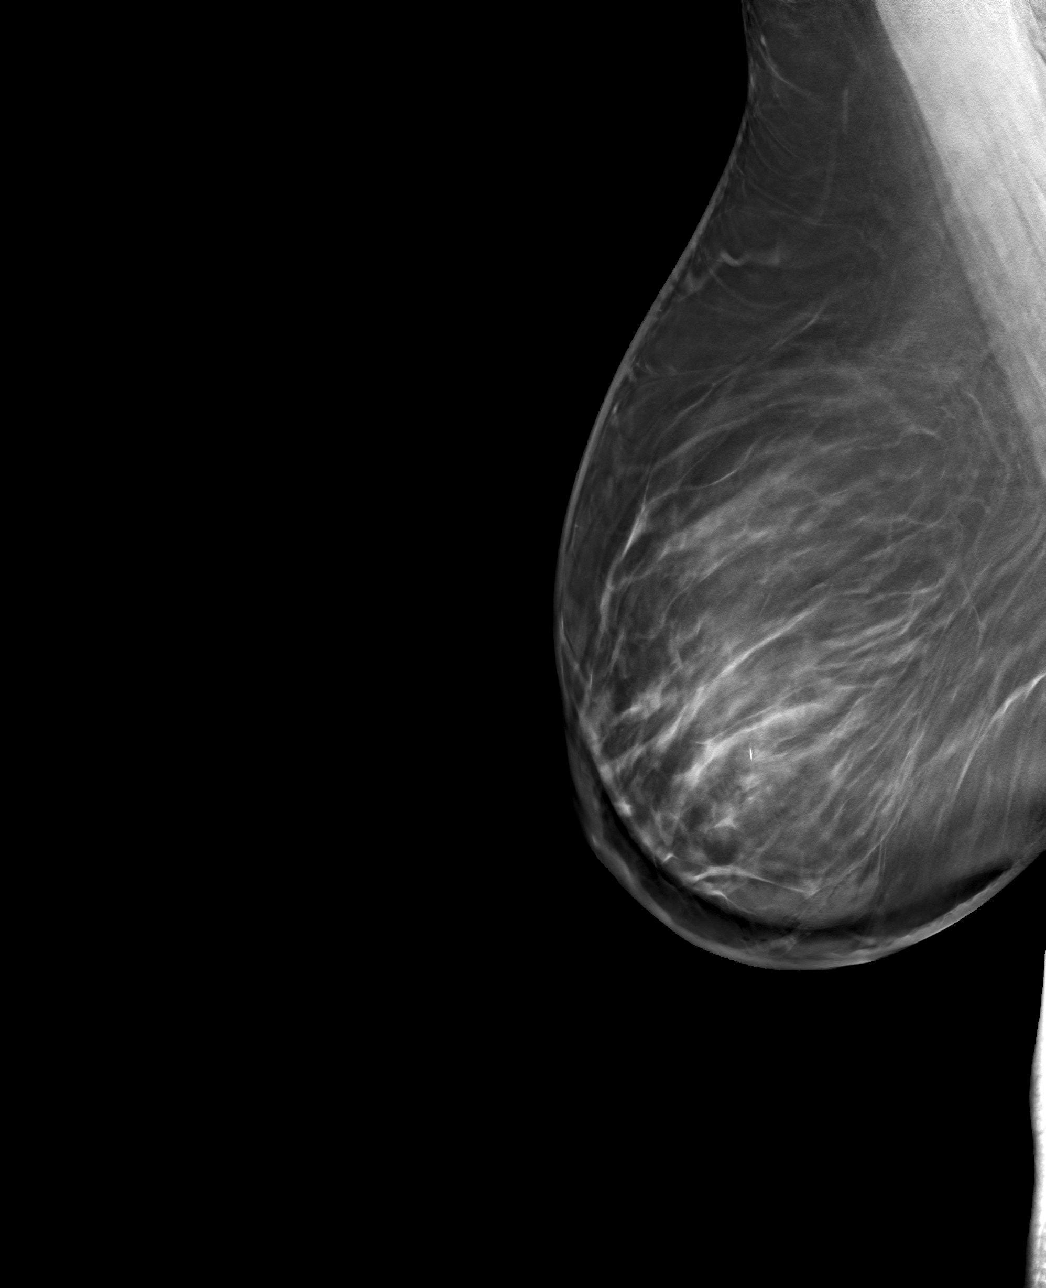

[R CC tomo · tomo slice 41/81.0]
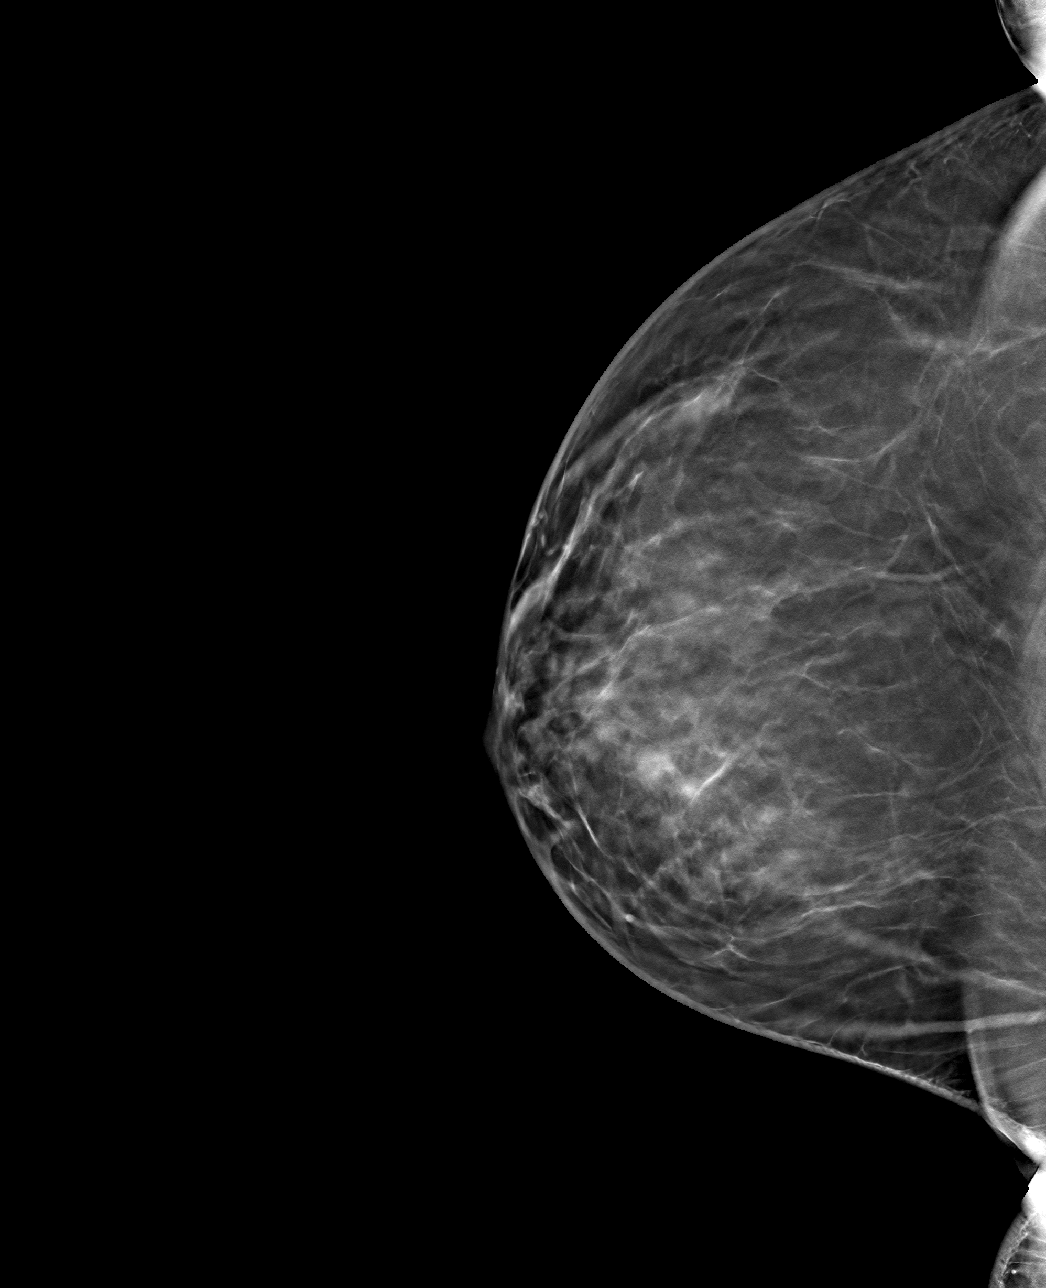

[4 of 12 positions shown; findings below may reference images not displayed]

FINDINGS: 3D Mammographic images were obtained following ultrasound guided
biopsy of a right breast mass. The biopsy marking clip is in
expected position at the site of biopsy.
IMPRESSION: Appropriate positioning of the ribbon shaped biopsy marking clip at
the site of biopsy in the within the mass in the retroareolar right
breast.

Final Assessment: Post Procedure Mammograms for Marker Placement

## 2024-08-08 ENCOUNTER — Encounter: Payer: Self-pay | Admitting: Internal Medicine

## 2024-09-06 ENCOUNTER — Ambulatory Visit (AMBULATORY_SURGERY_CENTER)

## 2024-09-06 VITALS — Ht 62.0 in | Wt 168.6 lb

## 2024-09-06 DIAGNOSIS — Z1211 Encounter for screening for malignant neoplasm of colon: Secondary | ICD-10-CM

## 2024-09-06 MED ORDER — NA SULFATE-K SULFATE-MG SULF 17.5-3.13-1.6 GM/177ML PO SOLN: 1.0000 | Freq: Once | ORAL | 0 refills | Status: AC

## 2024-09-06 NOTE — Progress Notes (Signed)
 No egg or soy allergy known to patient  No issues known to pt with past sedation with any surgeries or procedures Patient denies ever being told they had issues or difficulty with intubation  No FH of Malignant Hyperthermia Pt is not on diet pills, Taking Mounjaro and hold instructions provided Pt is not on  home 02  Pt is not on blood thinners  Pt denies issues with constipation  No A fib or A flutter Have any cardiac testing pending--No Pt can ambulate  Pt denies use of chewing tobacco Discussed diabetic I weight loss medication holds Discussed NSAID holds Checked BMI Pt instructed to use Single[employer]com or GoodRx for a price reduction on prep  Patient's chart reviewed by Norleen Schillings CNRA prior to previsit and patient appropriate for the [facility].  Pre visit completed and red dot placed by patient's name on their procedure day (on provider's schedule).

## 2024-09-09 ENCOUNTER — Encounter: Payer: Self-pay | Admitting: Internal Medicine

## 2024-09-17 ENCOUNTER — Ambulatory Visit: Admitting: Internal Medicine

## 2024-09-17 ENCOUNTER — Encounter: Payer: Self-pay | Admitting: Internal Medicine

## 2024-09-17 VITALS — BP 110/62 | HR 56 | Temp 97.3°F | Resp 17 | Ht 62.0 in | Wt 168.0 lb

## 2024-09-17 DIAGNOSIS — Z1211 Encounter for screening for malignant neoplasm of colon: Secondary | ICD-10-CM

## 2024-09-17 MED ORDER — SODIUM CHLORIDE 0.9 % IV SOLN: 500.0000 mL | Freq: Once | INTRAVENOUS | Status: DC

## 2024-09-17 NOTE — Progress Notes (Signed)
 HISTORY OF PRESENT ILLNESS:  [name] Curtis is a 43 y.o. female sent directly for screening colonoscopy.  No complaints  REVIEW OF SYSTEMS:  All non-GI ROS negative except for  Past Medical History:  Diagnosis Date   Gestational diabetes    Hypertension    Hypothyroidism    Migraine headache    Pregnancy induced hypertension     Past Surgical History:  Procedure Laterality Date   CESAREAN SECTION N/A 08/10/2015   Procedure: CESAREAN SECTION;  Surgeon: Lynwood Clubs, MD;  Location: [facility];  Service: Obstetrics;  Laterality: N/A;    Social History [name] Curtis  reports that she has never smoked. She has never used smokeless tobacco. She reports that she does not drink alcohol and does not use drugs.  family history includes Hypertension in her mother.  No Known Allergies     PHYSICAL EXAMINATION: Vital signs: BP 117/78   Pulse 67   Temp (!) 97.3 F (36.3 C)   Resp 15   Ht 5' 2 (1.575 m)   Wt 168 lb (76.2 kg)   LMP 09/04/2024   SpO2 100%   BMI 30.73 kg/m  General: Well-developed, well-nourished, no acute distress HEENT: Sclerae are anicteric, conjunctiva pink. Oral mucosa intact Lungs: Clear Heart: Regular Abdomen: soft, nontender, nondistended, no obvious ascites, no peritoneal signs, normal bowel sounds. No organomegaly. Extremities: No edema Psychiatric: alert and oriented x3. Cooperative     ASSESSMENT:  Colon cancer screening   PLAN:   Screening colonoscopy

## 2024-09-17 NOTE — Progress Notes (Signed)
 Pt's states no medical or surgical changes since previsit or office visit.

## 2024-09-17 NOTE — Op Note (Signed)
 [facility] Patient Name: [name] Curtis Procedure Date: 09/17/2024 11:19 AM MRN: [id number] Endoscopist: Norleen SAILOR. Abran , MD, [id number] Age: 43 Referring MD:  Date of Birth: Aug 02, 1981 Gender: Female Account #: [id number] Procedure:                Colonoscopy Indications:              Screening for colorectal malignant neoplasm Medicines:                Monitored Anesthesia Care Procedure:                Pre-Anesthesia Assessment:                           - Prior to the procedure, a History and Physical                            was performed, and patient medications and                            allergies were reviewed. The patient's tolerance of                            previous anesthesia was also reviewed. The risks                            and benefits of the procedure and the sedation                            options and risks were discussed with the patient.                            All questions were answered, and informed consent                            was obtained. Prior Anticoagulants: The patient has                            taken no anticoagulant or antiplatelet agents. ASA                            Grade Assessment: II - A patient with mild systemic                            disease. After reviewing the risks and benefits,                            the patient was deemed in satisfactory condition to                            undergo the procedure.                           After obtaining informed consent, the colonoscope  was passed under direct vision. Throughout the                            procedure, the patient's blood pressure, pulse, and                            oxygen saturations were monitored continuously. The                            Olympus Scope SN [id number] was introduced through the                            anus and advanced to the the cecum, identified by                             appendiceal orifice and ileocecal valve. The                            ileocecal valve, appendiceal orifice, and rectum                            were photographed. The quality of the bowel                            preparation was excellent. The colonoscopy was                            performed without difficulty. The patient tolerated                            the procedure well. The bowel preparation used was                            SUPREP via split dose instruction. Scope In: 11:28:03 AM Scope Out: 11:37:02 AM Scope Withdrawal Time: 0 hours 5 minutes 48 seconds  Total Procedure Duration: 0 hours 8 minutes 59 seconds  Findings:                 The entire examined colon appeared normal on direct                            and retroflexion views. Complications:            No immediate complications. Estimated blood loss:                            None. Estimated Blood Loss:     Estimated blood loss: none. Impression:               - The entire examined colon is normal on direct and                            retroflexion views.                           -  No specimens collected. Recommendation:           - Repeat colonoscopy in 10 years for screening                            purposes.                           - Patient has a contact number available for                            emergencies. The signs and symptoms of potential                            delayed complications were discussed with the                            patient. Return to normal activities tomorrow.                            Written discharge instructions were provided to the                            patient.                           - Resume previous diet.                           - Continue present medications. Norleen SAILOR. Abran, MD 09/17/2024 11:45:45 AM This report has been signed electronically.

## 2024-09-17 NOTE — Progress Notes (Signed)
 Sedate, gd SR, tolerated procedure well, VSS, report to RN

## 2024-09-17 NOTE — Patient Instructions (Addendum)
Resume previous diet Continue present medications There were no colon polyps seen today!   You will need another screening colonoscopy in 10 years, you will receive a letter at that time when you are due for the procedure.  Please call us at [phone] if you have a change in bowel habits, change in family history of colo-rectal cancer, rectal bleeding or other GI concern before that time.  YOU HAD AN ENDOSCOPIC PROCEDURE TODAY AT THE  ENDOSCOPY CENTER:   Refer to the procedure report that was given to you for any specific questions about what was found during the examination.  If the procedure report does not answer your questions, please call your gastroenterologist to clarify.  If you requested that your care partner not be given the details of your procedure findings, then the procedure report has been included in a sealed envelope for you to review at your convenience later.  YOU SHOULD EXPECT: Some feelings of bloating in the abdomen. Passage of more gas than usual.  Walking can help get rid of the air that was put into your GI tract during the procedure and reduce the bloating. If you had a lower endoscopy (such as a colonoscopy or flexible sigmoidoscopy) you may notice spotting of blood in your stool or on the toilet paper. If you underwent a bowel prep for your procedure, you may not have a normal bowel movement for a few days.  Please Note:  You might notice some irritation and congestion in your nose or some drainage.  This is from the oxygen used during your procedure.  There is no need for concern and it should clear up in a day or so.  SYMPTOMS TO REPORT IMMEDIATELY:  Following lower endoscopy (colonoscopy):  Excessive amounts of blood in the stool  Significant tenderness or worsening of abdominal pains  Swelling of the abdomen that is new, acute  Fever of 100F or higher  For urgent or emergent issues, a gastroenterologist can be reached at any hour by calling [phone]  [phone]. Do not use MyChart messaging for urgent concerns.   DIET:  We do recommend a small meal at first, but then you may proceed to your regular diet.  Drink plenty of fluids but you should avoid alcoholic beverages for 24 hours.  ACTIVITY:  You should plan to take it easy for the rest of today and you should NOT DRIVE or use heavy machinery until tomorrow (because of the sedation medicines used during the test).    FOLLOW UP: Our staff will call the number listed on your records the next business day following your procedure.  We will call around 7:15- 8:00 am to check on you and address any questions or concerns that you may have regarding the information given to you following your procedure. If we do not reach you, we will leave a message.     SIGNATURES/CONFIDENTIALITY: You and/or your care partner have signed paperwork which will be entered into your electronic medical record.  These signatures attest to the fact that that the information above on your After Visit Summary has been reviewed and is understood.  Full responsibility of the confidentiality of this discharge information lies with you and/or your care-partner.

## 2024-09-18 ENCOUNTER — Telehealth: Payer: Self-pay | Admitting: *Deleted

## 2024-09-18 NOTE — Telephone Encounter (Signed)
 No answer on follow up call. Left message.
